# Patient Record
Sex: Female | Born: 1950 | Race: Black or African American | Hispanic: No | Marital: Single | Smoking: Current every day smoker
Health system: Southern US, Community
[De-identification: ages and names within clinical notes are randomized; demographics above are authoritative.]

## PROBLEM LIST (undated history)

## (undated) DIAGNOSIS — K5790 Diverticulosis of intestine, part unspecified, without perforation or abscess without bleeding: Secondary | ICD-10-CM

## (undated) DIAGNOSIS — E782 Mixed hyperlipidemia: Secondary | ICD-10-CM

## (undated) DIAGNOSIS — I4510 Unspecified right bundle-branch block: Secondary | ICD-10-CM

## (undated) DIAGNOSIS — I700 Atherosclerosis of aorta: Secondary | ICD-10-CM

## (undated) DIAGNOSIS — J410 Simple chronic bronchitis: Secondary | ICD-10-CM

## (undated) DIAGNOSIS — E119 Type 2 diabetes mellitus without complications: Secondary | ICD-10-CM

## (undated) DIAGNOSIS — J449 Chronic obstructive pulmonary disease, unspecified: Secondary | ICD-10-CM

## (undated) DIAGNOSIS — K219 Gastro-esophageal reflux disease without esophagitis: Secondary | ICD-10-CM

## (undated) DIAGNOSIS — Z794 Long term (current) use of insulin: Secondary | ICD-10-CM

## (undated) DIAGNOSIS — I2510 Atherosclerotic heart disease of native coronary artery without angina pectoris: Secondary | ICD-10-CM

## (undated) DIAGNOSIS — Z720 Tobacco use: Secondary | ICD-10-CM

## (undated) DIAGNOSIS — I10 Essential (primary) hypertension: Secondary | ICD-10-CM

## (undated) HISTORY — PX: APPENDECTOMY: SHX54

## (undated) HISTORY — PX: TUBAL LIGATION: SHX77

---

## 1968-06-24 HISTORY — PX: APPENDECTOMY: SHX54

## 2000-11-04 ENCOUNTER — Encounter: Payer: Self-pay | Admitting: *Deleted

## 2000-11-04 ENCOUNTER — Ambulatory Visit (HOSPITAL_COMMUNITY): Admission: RE | Admit: 2000-11-04 | Discharge: 2000-11-04 | Payer: Self-pay | Admitting: *Deleted

## 2001-07-03 ENCOUNTER — Ambulatory Visit (HOSPITAL_COMMUNITY): Admission: RE | Admit: 2001-07-03 | Discharge: 2001-07-03 | Payer: Self-pay | Admitting: *Deleted

## 2001-07-03 ENCOUNTER — Encounter: Payer: Self-pay | Admitting: *Deleted

## 2001-11-13 ENCOUNTER — Ambulatory Visit (HOSPITAL_COMMUNITY): Admission: RE | Admit: 2001-11-13 | Discharge: 2001-11-13 | Payer: Self-pay | Admitting: Family Medicine

## 2001-11-13 ENCOUNTER — Encounter: Payer: Self-pay | Admitting: Family Medicine

## 2001-12-07 ENCOUNTER — Ambulatory Visit (HOSPITAL_COMMUNITY): Admission: RE | Admit: 2001-12-07 | Discharge: 2001-12-07 | Payer: Self-pay | Admitting: Family Medicine

## 2001-12-07 ENCOUNTER — Encounter: Payer: Self-pay | Admitting: Family Medicine

## 2005-01-22 ENCOUNTER — Ambulatory Visit (HOSPITAL_COMMUNITY): Admission: RE | Admit: 2005-01-22 | Discharge: 2005-01-22 | Payer: Self-pay | Admitting: Family Medicine

## 2006-10-03 ENCOUNTER — Ambulatory Visit (HOSPITAL_COMMUNITY): Admission: RE | Admit: 2006-10-03 | Discharge: 2006-10-03 | Payer: Self-pay | Admitting: Family Medicine

## 2014-02-23 ENCOUNTER — Other Ambulatory Visit (HOSPITAL_COMMUNITY): Payer: Self-pay | Admitting: Physician Assistant

## 2014-02-23 DIAGNOSIS — R9389 Abnormal findings on diagnostic imaging of other specified body structures: Secondary | ICD-10-CM

## 2014-03-01 ENCOUNTER — Ambulatory Visit (HOSPITAL_COMMUNITY)
Admission: RE | Admit: 2014-03-01 | Discharge: 2014-03-01 | Disposition: A | Discharge status: Home / Self Care | Payer: Medicare Other | Source: Ambulatory Visit | Attending: Physician Assistant | Admitting: Physician Assistant

## 2014-03-01 DIAGNOSIS — J984 Other disorders of lung: Secondary | ICD-10-CM | POA: Diagnosis not present

## 2014-03-01 DIAGNOSIS — J438 Other emphysema: Secondary | ICD-10-CM | POA: Insufficient documentation

## 2014-03-01 DIAGNOSIS — R918 Other nonspecific abnormal finding of lung field: Secondary | ICD-10-CM | POA: Diagnosis present

## 2014-03-01 DIAGNOSIS — F17200 Nicotine dependence, unspecified, uncomplicated: Secondary | ICD-10-CM | POA: Diagnosis not present

## 2014-03-01 DIAGNOSIS — R9389 Abnormal findings on diagnostic imaging of other specified body structures: Secondary | ICD-10-CM

## 2014-03-01 MED ORDER — IOHEXOL 300 MG/ML  SOLN
80.0000 mL | Freq: Once | INTRAMUSCULAR | Status: AC | PRN
  Administered 2014-03-01: 80 mL via INTRAVENOUS

## 2014-03-02 LAB — POCT I-STAT CREATININE: Creatinine, Ser: 1.2 mg/dL — ABNORMAL HIGH (ref 0.50–1.10)

## 2015-10-04 ENCOUNTER — Other Ambulatory Visit (HOSPITAL_COMMUNITY): Payer: Self-pay | Admitting: Family

## 2015-10-04 DIAGNOSIS — Z1231 Encounter for screening mammogram for malignant neoplasm of breast: Secondary | ICD-10-CM

## 2015-10-11 ENCOUNTER — Ambulatory Visit (HOSPITAL_COMMUNITY)
Admission: RE | Admit: 2015-10-11 | Discharge: 2015-10-11 | Disposition: A | Discharge status: Home / Self Care | Payer: Medicare Other | Source: Ambulatory Visit | Attending: Family | Admitting: Family

## 2015-10-11 DIAGNOSIS — Z1231 Encounter for screening mammogram for malignant neoplasm of breast: Secondary | ICD-10-CM

## 2016-02-07 ENCOUNTER — Telehealth: Payer: Self-pay

## 2016-02-07 NOTE — Telephone Encounter (Signed)
PCP was calling to follow up on referral for tcs.

## 2016-02-08 NOTE — Telephone Encounter (Signed)
I cc'ed the letter to the PCP

## 2016-02-08 NOTE — Telephone Encounter (Signed)
Referral was received yesterday. As usual, letter mailed today. Please let PCP know.  Thanks!

## 2016-09-10 ENCOUNTER — Other Ambulatory Visit (HOSPITAL_COMMUNITY): Payer: Self-pay | Admitting: Family

## 2016-09-10 DIAGNOSIS — Z1231 Encounter for screening mammogram for malignant neoplasm of breast: Secondary | ICD-10-CM

## 2016-10-17 ENCOUNTER — Ambulatory Visit (HOSPITAL_COMMUNITY)
Admission: RE | Admit: 2016-10-17 | Discharge: 2016-10-17 | Disposition: A | Discharge status: Home / Self Care | Payer: Medicare Other | Source: Ambulatory Visit | Attending: Family | Admitting: Family

## 2016-10-17 DIAGNOSIS — Z1231 Encounter for screening mammogram for malignant neoplasm of breast: Secondary | ICD-10-CM | POA: Insufficient documentation

## 2016-12-26 ENCOUNTER — Telehealth: Payer: Self-pay

## 2016-12-26 NOTE — Telephone Encounter (Signed)
See separate triage.  

## 2016-12-26 NOTE — Telephone Encounter (Signed)
[phone] patient received letter to schedule tcs

## 2017-01-15 NOTE — Telephone Encounter (Signed)
Waiting on call from pt to see if she has ever had any complications with anesthesia.

## 2017-01-23 NOTE — Telephone Encounter (Signed)
No diabetes medication day of procedure. Appropriate.

## 2017-01-23 NOTE — Telephone Encounter (Signed)
Gastroenterology Pre-Procedure Review  Request Date:12/26/2016 Requesting Physician: Dr. Kenton Kingfisher  PATIENT REVIEW QUESTIONS: The patient responded to the following health history questions as indicated:    1. Diabetes Melitis: YES 2. Joint replacements in the past 12 months: no 3. Major health problems in the past 3 months: no 4. Has an artificial valve or MVP: no 5. Has a defibrillator: no 6. Has been advised in past to take antibiotics in advance of a procedure like teeth cleaning: no 7. Family history of colon cancer: YES    Father died at age 70, not sure when he was diagnosed 64. Alcohol Use: no 9. History of sleep apnea: no  10. History of coronary artery or other vascular stents placed within the last 12 months: no 11. History of any prior anesthesia complications: no    MEDICATIONS & ALLERGIES:    Patient reports the following regarding taking any blood thinners:   Plavix? no Aspirin?YES Coumadin? no Brilinta? no Xarelto? no Eliquis? no Pradaxa? no Savaysa? no Effient? no  Patient confirms/reports the following medications:  Current Outpatient Prescriptions  Medication Sig Dispense Refill  . amLODipine (NORVASC) 5 MG tablet Take 5 mg by mouth daily.    Marland Kitchen aspirin EC 81 MG tablet Take 81 mg by mouth daily.    Marland Kitchen atorvastatin (LIPITOR) 40 MG tablet Take 40 mg by mouth daily.    . cetirizine (ZYRTEC) 10 MG tablet Take 10 mg by mouth daily.    . Cinnamon 500 MG TABS Take 1,000 mg by mouth daily.    Marland Kitchen glimepiride (AMARYL) 4 MG tablet Take 4 mg by mouth daily with breakfast.    . lisinopril (PRINIVIL,ZESTRIL) 40 MG tablet Take 40 mg by mouth daily.    . metFORMIN (GLUCOPHAGE) 500 MG tablet Take by mouth 2 (two) times daily with a meal.    . Multiple Vitamin (MULTIVITAMIN) tablet Take 1 tablet by mouth daily.    . Omega-3 Fatty Acids (FISH OIL) 1200 MG CAPS Take by mouth 2 (two) times daily.     No current facility-administered medications for this visit.     Patient  confirms/reports the following allergies:  Allergies  Allergen Reactions  . Other     ORANGES/ CAUSES SWELLING IN EYES AND LIPS    No orders of the defined types were placed in this encounter.   AUTHORIZATION INFORMATION Primary Insurance:  ID #:   Group #:  Pre-Cert / Auth required:  Pre-Cert / Auth #:   Secondary Insurance:  ID #:   Group #:  Pre-Cert / Auth required:  Pre-Cert / Auth #:   SCHEDULE INFORMATION: Procedure has been scheduled as follows:  Date:                    Time:   Location:   This Gastroenterology Pre-Precedure Review Form is being routed to the following provider(s): R. Garfield Cornea, MD

## 2017-02-05 NOTE — Telephone Encounter (Signed)
LMOM to call, needs date and time for colonoscopy with Dr. Gala Romney.

## 2017-02-06 ENCOUNTER — Other Ambulatory Visit: Payer: Self-pay

## 2017-02-06 DIAGNOSIS — Z800 Family history of malignant neoplasm of digestive organs: Secondary | ICD-10-CM

## 2017-02-06 MED ORDER — PEG 3350-KCL-NA BICARB-NACL 420 G PO SOLR
4000.0000 mL | ORAL | 0 refills | Status: DC
Start: 2017-02-06 — End: 2021-12-14

## 2017-02-06 NOTE — Telephone Encounter (Signed)
Rx sent to the pharmacy and instructions mailed to pt.  

## 2017-02-06 NOTE — Telephone Encounter (Signed)
PT called and has been scheduled for 02/19/2017 at 8:15 Am with Dr. Gala Romney.

## 2017-02-14 ENCOUNTER — Ambulatory Visit: Payer: Medicare Other

## 2017-02-18 NOTE — Telephone Encounter (Signed)
NO PA is needed for TCS 

## 2017-02-19 ENCOUNTER — Encounter (HOSPITAL_COMMUNITY)
Admission: RE | Disposition: A | Discharge status: Home / Self Care | Payer: Self-pay | Source: Ambulatory Visit | Attending: Internal Medicine

## 2017-02-19 ENCOUNTER — Ambulatory Visit (HOSPITAL_COMMUNITY)
Admission: RE | Admit: 2017-02-19 | Discharge: 2017-02-19 | Disposition: A | Discharge status: Home / Self Care | Payer: Medicare Other | Source: Ambulatory Visit | Attending: Internal Medicine | Admitting: Internal Medicine

## 2017-02-19 ENCOUNTER — Encounter (HOSPITAL_COMMUNITY): Payer: Self-pay | Admitting: *Deleted

## 2017-02-19 DIAGNOSIS — F17210 Nicotine dependence, cigarettes, uncomplicated: Secondary | ICD-10-CM | POA: Diagnosis not present

## 2017-02-19 DIAGNOSIS — Z79899 Other long term (current) drug therapy: Secondary | ICD-10-CM | POA: Diagnosis not present

## 2017-02-19 DIAGNOSIS — I10 Essential (primary) hypertension: Secondary | ICD-10-CM | POA: Insufficient documentation

## 2017-02-19 DIAGNOSIS — Z7982 Long term (current) use of aspirin: Secondary | ICD-10-CM | POA: Insufficient documentation

## 2017-02-19 DIAGNOSIS — K5790 Diverticulosis of intestine, part unspecified, without perforation or abscess without bleeding: Secondary | ICD-10-CM | POA: Insufficient documentation

## 2017-02-19 DIAGNOSIS — E119 Type 2 diabetes mellitus without complications: Secondary | ICD-10-CM | POA: Insufficient documentation

## 2017-02-19 DIAGNOSIS — Z1212 Encounter for screening for malignant neoplasm of rectum: Secondary | ICD-10-CM | POA: Diagnosis not present

## 2017-02-19 DIAGNOSIS — Z800 Family history of malignant neoplasm of digestive organs: Secondary | ICD-10-CM | POA: Diagnosis not present

## 2017-02-19 DIAGNOSIS — D122 Benign neoplasm of ascending colon: Secondary | ICD-10-CM | POA: Insufficient documentation

## 2017-02-19 DIAGNOSIS — Z1211 Encounter for screening for malignant neoplasm of colon: Secondary | ICD-10-CM | POA: Insufficient documentation

## 2017-02-19 DIAGNOSIS — Z794 Long term (current) use of insulin: Secondary | ICD-10-CM | POA: Insufficient documentation

## 2017-02-19 HISTORY — PX: COLONOSCOPY: SHX5424

## 2017-02-19 HISTORY — DX: Essential (primary) hypertension: I10

## 2017-02-19 HISTORY — DX: Type 2 diabetes mellitus without complications: E11.9

## 2017-02-19 LAB — GLUCOSE, CAPILLARY: GLUCOSE-CAPILLARY: 165 mg/dL — AB (ref 65–99)

## 2017-02-19 SURGERY — COLONOSCOPY
Anesthesia: Moderate Sedation

## 2017-02-19 MED ORDER — MEPERIDINE HCL 100 MG/ML IJ SOLN
INTRAMUSCULAR | Status: AC
  Filled 2017-02-19: qty 2

## 2017-02-19 MED ORDER — ONDANSETRON HCL 4 MG/2ML IJ SOLN
INTRAMUSCULAR | Status: AC
  Filled 2017-02-19: qty 2

## 2017-02-19 MED ORDER — ONDANSETRON HCL 4 MG/2ML IJ SOLN
INTRAMUSCULAR | Status: DC | PRN
  Administered 2017-02-19: 4 mg via INTRAVENOUS

## 2017-02-19 MED ORDER — MIDAZOLAM HCL 5 MG/5ML IJ SOLN
INTRAMUSCULAR | Status: AC
  Filled 2017-02-19: qty 10

## 2017-02-19 MED ORDER — STERILE WATER FOR IRRIGATION IR SOLN
Status: DC | PRN
  Administered 2017-02-19: 09:00:00

## 2017-02-19 MED ORDER — SODIUM CHLORIDE 0.9 % IV SOLN
INTRAVENOUS | Status: DC
  Administered 2017-02-19: 1000 mL via INTRAVENOUS

## 2017-02-19 MED ORDER — MEPERIDINE HCL 100 MG/ML IJ SOLN
INTRAMUSCULAR | Status: DC | PRN
  Administered 2017-02-19: 25 mg via INTRAVENOUS

## 2017-02-19 MED ORDER — MIDAZOLAM HCL 5 MG/5ML IJ SOLN
INTRAMUSCULAR | Status: DC | PRN
  Administered 2017-02-19: 2 mg via INTRAVENOUS
  Administered 2017-02-19: 1 mg via INTRAVENOUS

## 2017-02-19 NOTE — Op Note (Signed)
[facility] Patient Name: Kristin Hughes Procedure Date: 02/19/2017 8:30 AM MRN: [id number] Date of Birth: 02-13-1951 Attending MD: Norvel Richards , MD CSN: [id number] Age: 66 Admit Type: Outpatient Procedure:                Colonoscopy Indications:              Screening in patient at increased risk: Family                            history of 1st-degree relative with colorectal                            cancer Providers:                Norvel Richards, MD, Hinton Rao, RN,                            Randa Spike, Technician Referring MD:              Medicines:                Midazolam 3 mg IV, Meperidine 25 mg IV Complications:            No immediate complications. Estimated Blood Loss:     Estimated blood loss was minimal. Procedure:                Pre-Anesthesia Assessment:                           - Prior to the procedure, a History and Physical                            was performed, and patient medications and                            allergies were reviewed. The patient's tolerance of                            previous anesthesia was also reviewed. The risks                            and benefits of the procedure and the sedation                            options and risks were discussed with the patient.                            All questions were answered, and informed consent                            was obtained. Prior Anticoagulants: The patient has                            taken no previous anticoagulant or antiplatelet  agents. ASA Grade Assessment: II - A patient with                            mild systemic disease. After reviewing the risks                            and benefits, the patient was deemed in                            satisfactory condition to undergo the procedure.                           After obtaining informed consent, the colonoscope                            was passed under  direct vision. Throughout the                            procedure, the patient's blood pressure, pulse, and                            oxygen saturations were monitored continuously. The                            [id number]) scope was introduced through the                            anus and advanced to the the cecum, identified by                            appendiceal orifice and ileocecal valve. The                            ileocecal valve, appendiceal orifice, and rectum                            were photographed. The entire colon was well                            visualized. The ileocecal valve, appendiceal                            orifice, and rectum were photographed. The quality                            of the bowel preparation was adequate. Scope In: 8:57:29 AM Scope Out: 9:13:57 AM Scope Withdrawal Time: 0 hours 12 minutes 40 seconds  Total Procedure Duration: 0 hours 16 minutes 28 seconds  Findings:      The perianal and digital rectal examinations were normal.      Two sessile polyps were found in the ascending colon. The polyps were 4       to 6 mm in size. These polyps were removed with a cold snare. Resection       and retrieval  were complete. Estimated blood loss was minimal.      The exam was otherwise without abnormality on direct and retroflexion       views. shallow diverticula scattered throughout the colon. Impression:               - Two 4 to 6 mm polyps in the ascending colon,                            removed with a cold snare. Resected and retrieved.                            Shallow diverticulosis                           - The examination was otherwise normal on direct                            and retroflexion views. Moderate Sedation:      Moderate (conscious) sedation was administered by the endoscopy nurse       and supervised by the endoscopist. The following parameters were       monitored: oxygen saturation, heart rate, blood  pressure, respiratory       rate, EKG, adequacy of pulmonary ventilation, and response to care.       Total physician intraservice time was 28 minutes. Recommendation:           - Patient has a contact number available for                            emergencies. The signs and symptoms of potential                            delayed complications were discussed with the                            patient. Return to normal activities tomorrow.                            Written discharge instructions were provided to the                            patient.                           - Resume previous diet.                           - Continue present medications.                           - Repeat colonoscopy date to be determined after                            pending pathology results are reviewed for                            surveillance  based on pathology results.                           - Return to GI clinic (date not yet determined). Procedure Code(s):        --- Professional ---                           [phone], Colonoscopy, flexible; with removal of                            tumor(s), polyp(s), or other lesion(s) by snare                            technique                           [id number], Moderate sedation services provided by the                            same physician or other qualified health care                            professional performing the diagnostic or                            therapeutic service that the sedation supports,                            requiring the presence of an independent trained                            observer to assist in the monitoring of the                            patient's level of consciousness and physiological                            status; initial 15 minutes of intraservice time,                            patient age 53 years or older                           [phone], Moderate sedation services; each additional                             15 minutes intraservice time Diagnosis Code(s):        --- Professional ---                           Z80.0, Family history of malignant neoplasm of                            digestive organs  D12.2, Benign neoplasm of ascending colon CPT copyright 2016 American [employer] Association. All rights reserved. The codes documented in this report are preliminary and upon coder review may  be revised to meet current compliance requirements. Cristopher Estimable. Rourk, MD Norvel Richards, MD 02/19/2017 9:20:43 AM This report has been signed electronically. Number of Addenda: 0

## 2017-02-19 NOTE — Discharge Instructions (Signed)
Colon Polyps °Polyps are tissue growths inside the body. Polyps can grow in many places, including the large intestine (colon). A polyp may be a round bump or a mushroom-shaped growth. You could have one polyp or several. °Most colon polyps are noncancerous (benign). However, some colon polyps can become cancerous over time. °What are the causes? °The exact cause of colon polyps is not known. °What increases the risk? °This condition is more likely to develop in people who: °· Have a family history of colon cancer or colon polyps. °· Are older than 50 or older than 45 if they are African American. °· Have inflammatory bowel disease, such as ulcerative colitis or Crohn disease. °· Are overweight. °· Smoke cigarettes. °· Do not get enough exercise. °· Drink too much alcohol. °· Eat a diet that is: °? High in fat and red meat. °? Low in fiber. °· Had childhood cancer that was treated with abdominal radiation. ° °What are the signs or symptoms? °Most polyps do not cause symptoms. If you have symptoms, they may include: °· Blood coming from your rectum when having a bowel movement. °· Blood in your stool. The stool may look dark red or black. °· A change in bowel habits, such as constipation or diarrhea. ° °How is this diagnosed? °This condition is diagnosed with a colonoscopy. This is a procedure that uses a lighted, flexible scope to look at the inside of your colon. °How is this treated? °Treatment for this condition involves removing any polyps that are found. Those polyps will then be tested for cancer. If cancer is found, your health care provider will talk to you about options for colon cancer treatment. °Follow these instructions at home: °Diet °· Eat plenty of fiber, such as fruits, vegetables, and whole grains. °· Eat foods that are high in calcium and vitamin D, such as milk, cheese, yogurt, eggs, liver, fish, and broccoli. °· Limit foods high in fat, red meats, and processed meats, such as hot dogs, sausage,  bacon, and lunch meats. °· Maintain a healthy weight, or lose weight if recommended by your health care provider. °General instructions °· Do not smoke cigarettes. °· Do not drink alcohol excessively. °· Keep all follow-up visits as told by your health care provider. This is important. This includes keeping regularly scheduled colonoscopies. Talk to your health care provider about when you need a colonoscopy. °· Exercise every day or as told by your health care provider. °Contact a health care provider if: °· You have new or worsening bleeding during a bowel movement. °· You have new or increased blood in your stool. °· You have a change in bowel habits. °· You unexpectedly lose weight. °This information is not intended to replace advice given to you by your health care provider. Make sure you discuss any questions you have with your health care provider. °Document Released: 03/06/2004 Document Revised: 11/16/2015 Document Reviewed: 05/01/2015 °Elsevier Interactive Patient Education © 2018 [employer] ° °Colonoscopy °Discharge Instructions ° °Read the instructions outlined below and refer to this sheet in the next few weeks. These discharge instructions provide you with general information on caring for yourself after you leave the hospital. Your doctor may also give you specific instructions. While your treatment has been planned according to the most current medical practices available, unavoidable complications occasionally occur. If you have any problems or questions after discharge, call Dr. Rourk at [phone]. °ACTIVITY °· You may resume your regular activity, but move at a slower pace for the next 24   hours.  °· Take frequent rest periods for the next 24 hours.  °· Walking will help get rid of the air and reduce the bloated feeling in your belly (abdomen).  °· No driving for 24 hours (because of the medicine (anesthesia) used during the test).   °· Do not sign any important legal documents or operate any  machinery for 24 hours (because of the anesthesia used during the test).  °NUTRITION °· Drink plenty of fluids.  °· You may resume your normal diet as instructed by your doctor.  °· Begin with a light meal and progress to your normal diet. Heavy or fried foods are harder to digest and may make you feel sick to your stomach (nauseated).  °· Avoid alcoholic beverages for 24 hours or as instructed.  °MEDICATIONS °· You may resume your normal medications unless your doctor tells you otherwise.  °WHAT YOU CAN EXPECT TODAY °· Some feelings of bloating in the abdomen.  °· Passage of more gas than usual.  °· Spotting of blood in your stool or on the toilet paper.  °IF YOU HAD POLYPS REMOVED DURING THE COLONOSCOPY: °· No aspirin products for 7 days or as instructed.  °· No alcohol for 7 days or as instructed.  °· Eat a soft diet for the next 24 hours.  °FINDING OUT THE RESULTS OF YOUR TEST °Not all test results are available during your visit. If your test results are not back during the visit, make an appointment with your caregiver to find out the results. Do not assume everything is normal if you have not heard from your caregiver or the medical facility. It is important for you to follow up on all of your test results.  °SEEK IMMEDIATE MEDICAL ATTENTION IF: °· You have more than a spotting of blood in your stool.  °· Your belly is swollen (abdominal distention).  °· You are nauseated or vomiting.  °· You have a temperature over 101.  °· You have abdominal pain or discomfort that is severe or gets worse throughout the day.  ° ° ° °Colon polyp information provided ° °Further recommendations to follow pending review of pathology report °

## 2017-02-19 NOTE — H&P (Signed)
@LOGO @   Primary Care Physician:  Joyice Faster, FNP Primary Gastroenterologist:  Dr. Gala Romney  Pre-Procedure History & Physical: HPI:  Kristin Hughes is a 66 y.o. female here for first ever screening colonoscopy. No bowel symptoms. Family history of colon cancer in patient's father.  Past Medical History:  Diagnosis Date  . Diabetes mellitus without complication ([place])   . Hypertension     Past Surgical History:  Procedure Laterality Date  . APPENDECTOMY    . TUBAL LIGATION      Prior to Admission medications   Medication Sig Start Date End Date Taking? Authorizing Provider  amLODipine (NORVASC) 5 MG tablet Take 5 mg by mouth daily.   Yes [provider]  aspirin EC 81 MG tablet Take 81 mg by mouth daily.   Yes [provider]  atorvastatin (LIPITOR) 40 MG tablet Take 40 mg by mouth daily.   Yes [provider]  cetirizine (ZYRTEC) 10 MG tablet Take 10 mg by mouth daily.   Yes [provider]  CINNAMON PO Take 2,000 mg by mouth at bedtime.   Yes [provider]  glimepiride (AMARYL) 4 MG tablet Take 4 mg by mouth daily with breakfast.   Yes [provider]  Ibuprofen-Diphenhydramine Cit (ADVIL PM) 200-38 MG TABS Take 1 tablet by mouth at bedtime as needed (for sleep.).   Yes [provider]  LEVEMIR FLEXTOUCH 100 UNIT/ML Pen Inject 26 Units into the skin at bedtime.  02/04/17  Yes [provider]  lisinopril (PRINIVIL,ZESTRIL) 40 MG tablet Take 40 mg by mouth daily.   Yes [provider]  metFORMIN (GLUCOPHAGE) 500 MG tablet Take 500 mg by mouth 2 (two) times daily with a meal.    Yes [provider]  Multiple Vitamin (MULTIVITAMIN) tablet Take 1 tablet by mouth daily. CENTRUM SILVER   Yes [provider]  Omega-3 Fatty Acids (FISH OIL) 1200 MG CAPS Take 2,400 mg by mouth at bedtime.    Yes [provider]  polyethylene glycol-electrolytes (TRILYTE) 420 g solution Take  4,000 mLs by mouth as directed. 02/06/17  Yes Daneil Dolin, MD    Allergies as of 02/06/2017 - Review Complete 01/13/2017  Allergen Reaction Noted  . Other  01/13/2017    Family History  Problem Relation Age of Onset  . Colon cancer Father     Social History   Social History  . Marital status: Single    Spouse name: N/A  . Number of children: N/A  . Years of education: N/A   Occupational History  . Not on file.   Social History Main Topics  . Smoking status: Current Every Day Smoker    Packs/day: 1.00    Types: Cigarettes  . Smokeless tobacco: Never Used  . Alcohol use No  . Drug use: No  . Sexual activity: Not on file   Other Topics Concern  . Not on file   Social History Narrative  . No narrative on file    Review of Systems: See HPI, otherwise negative ROS  Physical Exam: BP (!) 144/106   Pulse 95   Temp 97.9 F (36.6 C) (Oral)   Resp 18   Ht 5\' 4"  (1.626 m)   Wt 172 lb (78 kg)   SpO2 97%   BMI 29.52 kg/m  General:   Alert,  Well-developed, well-nourished, pleasant and cooperative in NAD Neck:  Supple; no masses or thyromegaly. No significant cervical adenopathy. Lungs:  Clear throughout to auscultation.  No wheezes, crackles, or rhonchi. No acute distress. Heart:  Regular rate and rhythm; no murmurs, clicks, rubs,  or gallops. Abdomen: Non-distended, normal bowel sounds.  Soft and nontender without appreciable mass or hepatosplenomegaly.  Pulses:  Normal pulses noted. Extremities:  Without clubbing or edema.  Impression: Pleasant 66 year old lady here for first ever screening colonoscopy. Positive family history colon cancer in her father.   Recommendations:  Screening colonoscopy today per plan.The risks, benefits, limitations, alternatives and imponderables have been reviewed with the patient. Questions have been answered. All parties are agreeable.   Notice: This dictation was prepared with Dragon dictation along with smaller phrase  technology. Any transcriptional errors that result from this process are unintentional and may not be corrected upon review.

## 2017-02-21 ENCOUNTER — Encounter: Payer: Self-pay | Admitting: Internal Medicine

## 2017-02-21 ENCOUNTER — Encounter (HOSPITAL_COMMUNITY): Payer: Self-pay | Admitting: Internal Medicine

## 2017-04-03 ENCOUNTER — Ambulatory Visit: Payer: Medicare Other | Admitting: Internal Medicine

## 2017-11-24 ENCOUNTER — Other Ambulatory Visit (HOSPITAL_COMMUNITY): Payer: Self-pay | Admitting: Family

## 2017-11-24 DIAGNOSIS — Z1231 Encounter for screening mammogram for malignant neoplasm of breast: Secondary | ICD-10-CM

## 2017-12-03 ENCOUNTER — Ambulatory Visit (HOSPITAL_COMMUNITY)
Admission: RE | Admit: 2017-12-03 | Discharge: 2017-12-03 | Disposition: A | Discharge status: Home / Self Care | Payer: Medicare Other | Source: Ambulatory Visit | Attending: Family | Admitting: Family

## 2017-12-03 ENCOUNTER — Encounter (HOSPITAL_COMMUNITY): Payer: Self-pay

## 2017-12-03 ENCOUNTER — Ambulatory Visit (HOSPITAL_COMMUNITY): Payer: Medicare Other

## 2017-12-03 DIAGNOSIS — Z1231 Encounter for screening mammogram for malignant neoplasm of breast: Secondary | ICD-10-CM | POA: Diagnosis present

## 2017-12-10 ENCOUNTER — Ambulatory Visit (HOSPITAL_COMMUNITY): Payer: Medicare Other

## 2019-01-13 ENCOUNTER — Other Ambulatory Visit (HOSPITAL_COMMUNITY): Payer: Self-pay | Admitting: Family

## 2019-01-13 DIAGNOSIS — Z1231 Encounter for screening mammogram for malignant neoplasm of breast: Secondary | ICD-10-CM

## 2020-10-31 ENCOUNTER — Other Ambulatory Visit (HOSPITAL_COMMUNITY): Payer: Self-pay | Admitting: Family Medicine

## 2020-10-31 ENCOUNTER — Other Ambulatory Visit: Payer: Self-pay | Admitting: Family Medicine

## 2020-10-31 DIAGNOSIS — F17200 Nicotine dependence, unspecified, uncomplicated: Secondary | ICD-10-CM

## 2020-10-31 DIAGNOSIS — J410 Simple chronic bronchitis: Secondary | ICD-10-CM

## 2020-11-29 ENCOUNTER — Ambulatory Visit (HOSPITAL_COMMUNITY)
Admission: RE | Admit: 2020-11-29 | Discharge: 2020-11-29 | Disposition: A | Discharge status: Home / Self Care | Payer: Medicare Other | Source: Ambulatory Visit | Attending: Family Medicine | Admitting: Family Medicine

## 2020-11-29 DIAGNOSIS — J410 Simple chronic bronchitis: Secondary | ICD-10-CM | POA: Diagnosis present

## 2020-11-29 DIAGNOSIS — J439 Emphysema, unspecified: Secondary | ICD-10-CM | POA: Diagnosis not present

## 2020-11-29 DIAGNOSIS — F17200 Nicotine dependence, unspecified, uncomplicated: Secondary | ICD-10-CM

## 2020-11-29 DIAGNOSIS — I700 Atherosclerosis of aorta: Secondary | ICD-10-CM | POA: Insufficient documentation

## 2020-11-29 DIAGNOSIS — F17210 Nicotine dependence, cigarettes, uncomplicated: Secondary | ICD-10-CM | POA: Insufficient documentation

## 2020-11-29 DIAGNOSIS — R911 Solitary pulmonary nodule: Secondary | ICD-10-CM

## 2020-11-29 HISTORY — DX: Solitary pulmonary nodule: R91.1

## 2021-11-08 ENCOUNTER — Other Ambulatory Visit (HOSPITAL_COMMUNITY): Payer: Self-pay | Admitting: Family Medicine

## 2021-11-08 ENCOUNTER — Other Ambulatory Visit: Payer: Self-pay | Admitting: Family Medicine

## 2021-11-08 DIAGNOSIS — I714 Abdominal aortic aneurysm, without rupture, unspecified: Secondary | ICD-10-CM

## 2021-11-09 ENCOUNTER — Other Ambulatory Visit (HOSPITAL_COMMUNITY): Payer: Self-pay | Admitting: Family

## 2021-11-09 ENCOUNTER — Ambulatory Visit (HOSPITAL_COMMUNITY)
Admission: RE | Admit: 2021-11-09 | Discharge: 2021-11-09 | Disposition: A | Discharge status: Home / Self Care | Payer: Medicare Other | Source: Ambulatory Visit | Attending: Family Medicine | Admitting: Family Medicine

## 2021-11-09 DIAGNOSIS — I714 Abdominal aortic aneurysm, without rupture, unspecified: Secondary | ICD-10-CM

## 2021-11-09 HISTORY — DX: Abdominal aortic aneurysm, without rupture, unspecified: I71.40

## 2021-11-09 MED ORDER — IOHEXOL 350 MG/ML SOLN
100.0000 mL | Freq: Once | INTRAVENOUS | Status: AC | PRN
  Administered 2021-11-09: 100 mL via INTRAVENOUS

## 2021-11-13 LAB — POCT I-STAT CREATININE: Creatinine, Ser: 1.1 mg/dL — ABNORMAL HIGH (ref 0.44–1.00)

## 2021-11-16 ENCOUNTER — Ambulatory Visit (INDEPENDENT_AMBULATORY_CARE_PROVIDER_SITE_OTHER): Payer: Medicare Other | Admitting: Vascular Surgery

## 2021-11-16 ENCOUNTER — Encounter (INDEPENDENT_AMBULATORY_CARE_PROVIDER_SITE_OTHER): Payer: Self-pay | Admitting: Vascular Surgery

## 2021-11-16 VITALS — BP 161/78 | HR 94 | Resp 16 | Ht 64.0 in | Wt 153.8 lb

## 2021-11-16 DIAGNOSIS — E1165 Type 2 diabetes mellitus with hyperglycemia: Secondary | ICD-10-CM | POA: Insufficient documentation

## 2021-11-16 DIAGNOSIS — E119 Type 2 diabetes mellitus without complications: Secondary | ICD-10-CM | POA: Diagnosis not present

## 2021-11-16 DIAGNOSIS — J410 Simple chronic bronchitis: Secondary | ICD-10-CM | POA: Insufficient documentation

## 2021-11-16 DIAGNOSIS — Z800 Family history of malignant neoplasm of digestive organs: Secondary | ICD-10-CM | POA: Insufficient documentation

## 2021-11-16 DIAGNOSIS — K219 Gastro-esophageal reflux disease without esophagitis: Secondary | ICD-10-CM | POA: Insufficient documentation

## 2021-11-16 DIAGNOSIS — E782 Mixed hyperlipidemia: Secondary | ICD-10-CM | POA: Diagnosis not present

## 2021-11-16 DIAGNOSIS — I7143 Infrarenal abdominal aortic aneurysm, without rupture: Secondary | ICD-10-CM

## 2021-11-16 DIAGNOSIS — I10 Essential (primary) hypertension: Secondary | ICD-10-CM | POA: Diagnosis not present

## 2021-11-16 DIAGNOSIS — Z720 Tobacco use: Secondary | ICD-10-CM | POA: Insufficient documentation

## 2021-11-16 DIAGNOSIS — Z1239 Encounter for other screening for malignant neoplasm of breast: Secondary | ICD-10-CM | POA: Insufficient documentation

## 2021-11-16 DIAGNOSIS — I714 Abdominal aortic aneurysm, without rupture, unspecified: Secondary | ICD-10-CM | POA: Insufficient documentation

## 2021-11-16 DIAGNOSIS — Z6830 Body mass index (BMI) 30.0-30.9, adult: Secondary | ICD-10-CM | POA: Insufficient documentation

## 2021-11-16 DIAGNOSIS — Z794 Long term (current) use of insulin: Secondary | ICD-10-CM | POA: Insufficient documentation

## 2021-11-16 DIAGNOSIS — J449 Chronic obstructive pulmonary disease, unspecified: Secondary | ICD-10-CM | POA: Insufficient documentation

## 2021-11-16 NOTE — Assessment & Plan Note (Signed)
blood glucose control important in reducing the progression of atherosclerotic disease. Also, involved in wound healing. On appropriate medications.  

## 2021-11-16 NOTE — Assessment & Plan Note (Signed)
blood pressure control important in reducing the progression of atherosclerotic disease and aneurysmal growth. On appropriate oral medications.  

## 2021-11-16 NOTE — Progress Notes (Signed)
Patient ID: Kristin Hughes, female   DOB: 11-22-1950, 71 y.o.   MRN: [id number]  Chief Complaint  Patient presents with   New Patient (Initial Visit)    Ref Kristin Hughes consult AAA    HPI Kristin Hughes is a 71 y.o. female.  I am asked to see the patient by Kristin Hughes for evaluation of an AAA.  The patient was having some back pain and had some plain x-rays which showed calcification and dilatation of the aorta.  This was immediately followed by CT scan of the abdomen and pelvis last week which I have independently reviewed.  This demonstrates an approximately 5.3 cm infrarenal abdominal aortic aneurysm.  There does appear to be some chronic dissection and mural thrombus associated with the aneurysm as well as some occlusive disease in the iliac arteries and visceral vessels.  She has chronic low back pain but no severe tearing back or abdominal pain and no signs of peripheral embolization.  She does not have a family history of aneurysm to her knowledge.  She does smoke and have hypertension.     Past Medical History:  Diagnosis Date   Diabetes mellitus without complication ([place])    Hypertension     Past Surgical History:  Procedure Laterality Date   APPENDECTOMY     COLONOSCOPY N/A 02/19/2017   Procedure: COLONOSCOPY;  Surgeon: Kristin Dolin, MD;  Location: [facility];  Service: Endoscopy;  Laterality: N/A;  8:15 am   TUBAL LIGATION       Family History  Problem Relation Age of Onset   Colon cancer Father   No bleeding or clotting disorders No aneurysms    Social History   Tobacco Use   Smoking status: Every Day    Packs/day: 1.00    Types: Cigarettes   Smokeless tobacco: Never  Vaping Use   Vaping Use: Never used  Substance Use Topics   Alcohol use: No   Drug use: No     Allergies  Allergen Reactions   Other     ORANGES/ CAUSES SWELLING IN EYES AND LIPS   Orange Oil     Other reaction(s): eyes, lips swells    Current Outpatient Medications   Medication Sig Dispense Refill   amLODipine (NORVASC) 5 MG tablet Take 5 mg by mouth daily.     aspirin EC 81 MG tablet Take 81 mg by mouth daily.     atorvastatin (LIPITOR) 40 MG tablet Take 40 mg by mouth daily.     cetirizine (ZYRTEC) 10 MG tablet Take 10 mg by mouth daily.     CINNAMON PO Take 2,000 mg by mouth at bedtime.     glimepiride (AMARYL) 4 MG tablet Take 4 mg by mouth daily with breakfast.     Ibuprofen-diphenhydrAMINE Cit 200-38 MG TABS Take 1 tablet by mouth at bedtime as needed (for sleep.).     LEVEMIR FLEXTOUCH 100 UNIT/ML Pen Inject 26 Units into the skin at bedtime.      lisinopril (PRINIVIL,ZESTRIL) 40 MG tablet Take 40 mg by mouth daily.     metFORMIN (GLUCOPHAGE) 500 MG tablet Take 500 mg by mouth 2 (two) times daily with a meal.      Multiple Vitamin (MULTIVITAMIN) tablet Take 1 tablet by mouth daily. CENTRUM SILVER     Omega-3 Fatty Acids (FISH OIL) 1200 MG CAPS Take 2,400 mg by mouth at bedtime.      polyethylene glycol-electrolytes (TRILYTE) 420 g solution Take 4,000 mLs by mouth as directed.  4000 mL 0   No current facility-administered medications for this visit.      REVIEW OF SYSTEMS (Negative unless checked)  Constitutional: '[]'$ Weight loss  '[]'$ Fever  '[]'$ Chills Cardiac: '[]'$ Chest pain   '[]'$ Chest pressure   '[]'$ Palpitations   '[]'$ Shortness of breath when laying flat   '[]'$ Shortness of breath at rest   '[]'$ Shortness of breath with exertion. Vascular:  '[]'$ Pain in legs with walking   '[]'$ Pain in legs at rest   '[]'$ Pain in legs when laying flat   '[]'$ Claudication   '[]'$ Pain in feet when walking  '[]'$ Pain in feet at rest  '[]'$ Pain in feet when laying flat   '[]'$ History of DVT   '[]'$ Phlebitis   '[]'$ Swelling in legs   '[]'$ Varicose veins   '[]'$ Non-healing ulcers Pulmonary:   '[]'$ Uses home oxygen   '[]'$ Productive cough   '[]'$ Hemoptysis   '[]'$ Wheeze  '[]'$ COPD   '[]'$ Asthma Neurologic:  '[]'$ Dizziness  '[]'$ Blackouts   '[]'$ Seizures   '[]'$ History of stroke   '[]'$ History of TIA  '[]'$ Aphasia   '[]'$ Temporary blindness   '[]'$ Dysphagia    '[]'$ Weakness or numbness in arms   '[]'$ Weakness or numbness in legs Musculoskeletal:  '[x]'$ Arthritis   '[]'$ Joint swelling   '[]'$ Joint pain   '[x]'$ Low back pain Hematologic:  '[]'$ Easy bruising  '[]'$ Easy bleeding   '[]'$ Hypercoagulable state   '[]'$ Anemic  '[]'$ Hepatitis Gastrointestinal:  '[]'$ Blood in stool   '[]'$ Vomiting blood  '[]'$ Gastroesophageal reflux/heartburn   '[]'$ Abdominal pain Genitourinary:  '[]'$ Chronic kidney disease   '[]'$ Difficult urination  '[]'$ Frequent urination  '[]'$ Burning with urination   '[]'$ Hematuria Skin:  '[]'$ Rashes   '[]'$ Ulcers   '[]'$ Wounds Psychological:  '[]'$ History of anxiety   '[]'$  History of major depression.    Physical Exam BP (!) 161/78 (BP Location: Left Arm)   Pulse 94   Resp 16   Ht '5\' 4"'$  (1.626 m)   Wt 153 lb 12.8 oz (69.8 kg)   BMI 26.40 kg/m  Gen:  WD/WN, NAD Head: [place]/AT, No temporalis wasting.  Ear/Nose/Throat: Hearing grossly intact, nares w/o erythema or drainage, oropharynx w/o Erythema/Exudate Eyes: Conjunctiva clear, sclera non-icteric  Neck: trachea midline.  No JVD.  Pulmonary:  Good air movement, respirations not labored, no use of accessory muscles  Cardiac: RRR, no JVD Vascular:  Vessel Right Left  Radial Palpable Palpable                                   Gastrointestinal:. No masses, surgical incisions, or scars.  Increased aortic impulse Musculoskeletal: M/S 5/5 throughout.  Extremities without ischemic changes.  No deformity or atrophy.  No significant lower extremity edema. Neurologic: Sensation grossly intact in extremities.  Symmetrical.  Speech is fluent. Motor exam as listed above. Psychiatric: Judgment intact, Mood & affect appropriate for pt's clinical situation. Dermatologic: No rashes or ulcers noted.  No cellulitis or open wounds.    Radiology CT Angio Abd/Pel w/ and/or w/o  Result Date: 11/09/2021 CLINICAL DATA:  Low back pain for the past several months. EXAM: CTA ABDOMEN AND PELVIS WITHOUT AND WITH CONTRAST TECHNIQUE: Multidetector CT imaging of the abdomen  and pelvis was performed using the standard protocol during bolus administration of intravenous contrast. Multiplanar reconstructed images and MIPs were obtained and reviewed to evaluate the vascular anatomy. RADIATION DOSE REDUCTION: This exam was performed according to the departmental dose-optimization program which includes automated exposure control, adjustment of the mA and/or kV according to patient size and/or use of iterative reconstruction technique. CONTRAST:  139m OMNIPAQUE IOHEXOL 350 MG/ML  SOLN COMPARISON:  Chest CT-11/29/2020; 03/01/2014. FINDINGS: VASCULAR Aorta: Fusiform aneurysmal dilatation of the abdominal aorta measuring 5.3 x 5.2 cm (axial image 103, series 4; sagittal image 104, series 8). The aneurysm begins approximately 3.6 cm caudal to the take-off of the most inferior left renal artery and extends to the level of the aortic bifurcation. 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 There is a large amount of mixed calcified and noncalcified thrombus throughout the abdominal aorta, not resulting in a hemodynamically significant stenosis. No periaortic stranding. Celiac: There is a minimal amount of mixed calcified and noncalcified atherosclerotic plaque involving the origin the celiac artery, not definitely resulting in hemodynamically significant stenosis. Conventional branching pattern. SMA: There is a large amount of eccentric calcified atherosclerotic plaque involving the origin the SMA which results in at least 50% luminal narrowing (sagittal image 104, series 8). Hemodynamic significance is supported by mild hypertrophy of the GDA and several pancreaticoduodenal arterial collaterals from the celiac artery. Conventional branching pattern. The distal tributaries of the SMA appear widely patent without discrete internal filling defect to suggest distal embolism. Renals:  Solitary bilaterally there is a minimal amount of mixed calcified and noncalcified atherosclerotic plaque involving the origin and proximal aspects of the bilateral renal arteries, not resulting in a hemodynamically significant stenosis. No definitive evidence of FMD. IMA: Remains patent. Inflow: There is a large amount of slightly irregular mixed calcified and noncalcified atherosclerotic plaque involving the bilateral normal caliber common iliac arteries which approaches 50% luminal narrowing bilaterally. The bilateral internal iliac arteries are heavily diseased though patent and of normal caliber. The bilateral external iliac arteries are tortuous though widely patent and of normal caliber. Proximal Outflow: Minimal to moderate amount of mixed calcified and noncalcified atherosclerotic plaque involves the bilateral common femoral arteries as well as the imaged proximal aspects of the bilateral superficial femoral arteries, not resulting in a hemodynamically significant stenosis. The bilateral deep femoral arteries are patent throughout their imaged courses. Veins: The IVC and pelvic venous systems appear widely patent. Review of the MIP images confirms the above findings. _________________________________________________________ _________________________________________________________ NON-VASCULAR Lower chest: Limited visualization of the lower thorax demonstrates minimal bibasilar subsegmental atelectasis. No discrete focal airspace opacities. No pleural effusion. Normal heart size. No pericardial effusion. Hepatobiliary: Normal hepatic contour. There is mild diffuse decreased attenuation of the hepatic parenchyma suggestive of hepatic steatosis. No discrete hepatic lesions. Normal appearance of the gallbladder given degree of distention. No radiopaque gallstones. No intra or extrahepatic biliary ductal dilatation. No ascites. Pancreas: Normal appearance of the pancreas. Spleen: Normal appearance of the  spleen. Adrenals/Urinary Tract: There is symmetric enhancement and excretion of the bilateral kidneys. Note is made of 5 right-sided renal cysts, the largest of which measures 1.4 cm in diameter (image 19, series 11). Additional subcentimeter hypoattenuating right-sided renal lesions are too small to adequately characterize though favored to represent additional renal cysts. No discrete left-sided renal lesions. No urinary obstruction or perinephric stranding. Normal appearance of the bilateral adrenal glands. Normal appearance of the urinary bladder given degree of distention. Stomach/Bowel: Moderate to large colonic stool burden without evidence of enteric obstruction. Scattered colonic diverticulosis without evidence of superimposed acute diverticulitis. Normal appearance of the terminal ileum. The appendix is not visualized compatible with provided operative history. No significant hiatal hernia. No pneumoperitoneum, pneumatosis or portal venous gas. Lymphatic: No bulky retroperitoneal, mesenteric, pelvic or inguinal lymphadenopathy. Reproductive: Normal appearance of  the pelvic organs. No discrete adnexal lesions. No free fluid in the pelvic cul-de-sac. Other: Tiny mesenteric fat containing periumbilical hernia. Musculoskeletal: No acute or aggressive osseous abnormalities. Note is made of partial lumbarization of the S1 vertebral body. Mild degenerative change the bilateral hips with joint space loss, subchondral sclerosis and osteophytosis. There is a peripherally corticated ossicle adjacent to the right greater trochanter. IMPRESSION: VASCULAR 1. Fusiform aneurysmal dilatation of the abdominal aorta measuring 5.3 cm in diameter. If not previously performed, vascular surgery consultation is advised. Otherwise, recommend follow-up CTA of the abdomen and pelvis in 3-6 months. This recommendation follows ACR consensus guidelines: White Paper of the ACR Incidental Findings Committee II on Vascular Findings. J Am  Coll Radiol 2013; 10:789-794. 2. 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 3. Suspected hemodynamically significant narrowing involving the SMA however the celiac and IMA remain patent (though note, the IMA comes off of the aneurysmal component of the abdominal aorta). 4. Irregular mixed calcified and noncalcified atherosclerotic plaque results in approximately 50% luminal narrowing of the bilateral common iliac arteries. NON-VASCULAR 1. Scattered colonic diverticulosis without evidence superimposed acute diverticulitis. 2. Suspected hepatic steatosis.  Correlation with LFTs is advised. These results will be called to the ordering clinician or representative by the Radiologist Assistant, and communication documented in the PACS or [employer]. Electronically Signed   By: Sandi Mariscal M.D.   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  Recommend:  The aneurysm is > 5 cm and therefore should undergo repair. Patient is status post CT scan of the abdominal aorta. The patient is a candidate for endovascular repair.   The patient will require cardiac clearance prior to stent graft placement.   The patient will continue antiplatelet therapy as prescribed (since the patient is undergoing endovascular repair as opposed to open repair) as well as aggressive management of hyperlipidemia. Exercise is again strongly encouraged.   The patient is reminded that lifetime routine surveillance is a necessity with an  endograft.   The risks and benefits of AAA repair are reviewed with the patient.  All questions are answered.  Alternative therapies are also discussed.  The patient agrees to proceed with endovascular aneurysm repair.  Patient will follow-up with me in the office after the surgery.  Benign essential hypertension blood pressure control important in reducing the progression of atherosclerotic disease and aneurysmal growth. On appropriate oral medications.   Type 2 diabetes mellitus without complications (HCC) blood glucose control important in reducing the progression of atherosclerotic disease. Also, involved in wound healing. On appropriate medications.   Mixed hyperlipidemia lipid control important in reducing the progression of atherosclerotic disease. Continue statin therapy      Leotis Pain 11/16/2021, 10:28 AM   This note was created with Dragon medical transcription system.  Any errors from dictation are unintentional.

## 2021-11-16 NOTE — Patient Instructions (Signed)
Abdominal Aortic Aneurysm Endograft Repair, Care After This sheet gives you information about how to care for yourself after your procedure. Your health care provider may also give you more specific instructions. If you have problems or questions, contact your health care provider. What can I expect after the procedure? After the procedure, it is common to have: Pain or soreness at the incision site. Tiredness (fatigue). Follow these instructions at home: Medicines Take over-the-counter and prescription medicines only as told by your health care provider. If you were prescribed an antibiotic medicine, take it as told by your health care provider. Do not stop using the antibiotic even if you start to feel better. If you are taking blood thinners: Talk with your health care provider before you take any medicines that contain aspirin or NSAIDs, such as ibuprofen. These medicines increase your risk for dangerous bleeding. Take your medicine exactly as told, at the same time every day. Avoid activities that could cause injury or bruising, and follow instructions about how to prevent falls. Wear a medical alert bracelet or carry a card that lists what medicines you take. Incision care  Follow instructions from your health care provider about how to take care of your incisions. Make sure you: Wash your hands with soap and water for at least 20 seconds before and after you change your bandage (dressing). If soap and water are not available, use hand sanitizer. Change your dressing as told by your health care provider. Leave stitches (sutures), skin glue, or adhesive strips in place. These skin closures may need to stay in place for 2 weeks or longer. If adhesive strip edges start to loosen and curl up, you may trim the loose edges. Do not remove adhesive strips completely unless your health care provider tells you to do that. Check your incision area every day for signs of infection. Check  for: Redness, swelling, or more pain. Fluid or blood. Warmth. Pus or a bad smell. Keep the incision area clean and dry. Do not take baths, swim, or use a hot tub until your health care provider approves. Ask your health care provider if you may take showers. You may only be allowed to take sponge baths. Activity  Rest as told by your health care provider. Avoid sitting for a long time without moving. Get up to take short walks every 1-2 hours. This is important to improve blood flow and breathing. Ask for help if you feel weak or unsteady. Do not lift anything that is heavier than 10 lb (4.5 kg), or the limit that you are told, until your health care provider says that it is safe. Do not drive until your health care provider says it is okay. Limit your other activities as told. Return to your normal activities as told by your health care provider. Ask your health care provider what activities are safe for you. Lifestyle  Do not use any products that contain nicotine or tobacco, such as cigarettes, e-cigarettes, and chewing tobacco. These can delay incision healing after surgery. If you need help quitting, ask your health care provider. Make any other lifestyle changes that your health care provider suggests. These may include: Keeping your blood pressure under control. Finding ways to lower stress. Eating healthy foods that are good for your heart, such as vegetables, fruits, and whole grains that add fiber to your diet. Getting regular exercise once your health care provider tells you it is safe to do so. General instructions Drink enough fluid to keep your urine   pale yellow. Before any dental work, tell your dentist about the graft in your aorta. You may need to take antibiotics to prevent your graft from getting infected. Keep all follow-up visits as told by your health care provider. This is important. Contact a health care provider if: You have pain in your abdomen, chest, or  back. You have redness or swelling around an incision. You have more pain around an incision. You have fluid or blood coming from an incision. Your incision feels warm to the touch. You have pus or a bad smell coming from an incision. You have a fever. Get help right away if: You have trouble breathing. You suddenly have pain in your legs, or you have trouble moving either of your legs. You faint, or you feel very light-headed. These symptoms may represent a serious problem that is an emergency. Do not wait to see if the symptoms will go away. Get medical help right away. Call your local emergency services (911 in the [place] Do not drive yourself to the hospital. Summary After abdominal aortic aneurysm endograft repair, it is common to feel tired or have pain or soreness at the incision site. Take all medicines as told by your health care provider. If you were given blood thinners, take them exactly as told by your health care provider. Wash your hands before and after caring for your incision. Leave sutures, skin glue, or adhesive strips in place for 2 weeks or longer. Rest as told. Avoid sitting for a long time without moving. Get up to take short walks every 1-2 hours. Let your health care provider know if you have pain in your abdomen, chest, or back. Get help right away if you have trouble breathing or have sudden pain in your legs. This information is not intended to replace advice given to you by your health care provider. Make sure you discuss any questions you have with your health care provider. Document Revised: 05/27/2019 Document Reviewed: 05/27/2019 Elsevier Patient Education  2023 Elsevier Inc[employer]  

## 2021-11-16 NOTE — Assessment & Plan Note (Signed)
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  Recommend:  The aneurysm is > 5 cm and therefore should undergo repair. Patient is status post CT scan of the abdominal aorta. The patient is a candidate for endovascular repair.   The patient will require cardiac clearance prior to stent graft placement.   The patient will continue antiplatelet therapy as prescribed (since the patient is undergoing endovascular repair as opposed to open repair) as well as aggressive management of hyperlipidemia. Exercise is again strongly encouraged.   The patient is reminded that lifetime routine surveillance is a necessity with an endograft.   The risks and benefits of AAA repair are reviewed with the patient.  All questions are answered.  Alternative therapies are also discussed.  The patient agrees to proceed with endovascular aneurysm repair.  Patient will follow-up with me in the office after the surgery.

## 2021-11-16 NOTE — Assessment & Plan Note (Signed)
lipid control important in reducing the progression of atherosclerotic disease. Continue statin therapy  

## 2021-12-14 ENCOUNTER — Ambulatory Visit: Payer: Medicare Other | Admitting: Cardiology

## 2021-12-14 ENCOUNTER — Encounter: Payer: Self-pay | Admitting: Cardiology

## 2021-12-14 VITALS — BP 140/82 | HR 90 | Ht 64.0 in | Wt 153.0 lb

## 2021-12-14 DIAGNOSIS — Z01810 Encounter for preprocedural cardiovascular examination: Secondary | ICD-10-CM

## 2021-12-14 DIAGNOSIS — Z13220 Encounter for screening for lipoid disorders: Secondary | ICD-10-CM

## 2021-12-14 DIAGNOSIS — I2510 Atherosclerotic heart disease of native coronary artery without angina pectoris: Secondary | ICD-10-CM | POA: Diagnosis not present

## 2021-12-14 DIAGNOSIS — F17200 Nicotine dependence, unspecified, uncomplicated: Secondary | ICD-10-CM | POA: Diagnosis not present

## 2021-12-14 DIAGNOSIS — I10 Essential (primary) hypertension: Secondary | ICD-10-CM | POA: Diagnosis not present

## 2021-12-14 DIAGNOSIS — J439 Emphysema, unspecified: Secondary | ICD-10-CM

## 2021-12-14 DIAGNOSIS — I7143 Infrarenal abdominal aortic aneurysm, without rupture: Secondary | ICD-10-CM

## 2021-12-19 ENCOUNTER — Ambulatory Visit (INDEPENDENT_AMBULATORY_CARE_PROVIDER_SITE_OTHER): Payer: Medicare Other

## 2021-12-19 DIAGNOSIS — I2510 Atherosclerotic heart disease of native coronary artery without angina pectoris: Secondary | ICD-10-CM | POA: Diagnosis not present

## 2021-12-19 LAB — ECHOCARDIOGRAM COMPLETE
Area-P 1/2: 3.21 cm2
MV VTI: 2.47 cm2
S' Lateral: 2.5 cm

## 2021-12-20 ENCOUNTER — Encounter
Admission: RE | Admit: 2021-12-20 | Discharge: 2021-12-20 | Disposition: A | Discharge status: Home / Self Care | Payer: Medicare Other | Source: Ambulatory Visit | Attending: Cardiology | Admitting: Cardiology

## 2021-12-20 ENCOUNTER — Telehealth: Payer: Self-pay

## 2021-12-20 DIAGNOSIS — I2510 Atherosclerotic heart disease of native coronary artery without angina pectoris: Secondary | ICD-10-CM | POA: Diagnosis present

## 2021-12-20 LAB — NM MYOCAR MULTI W/SPECT W/WALL MOTION / EF
LV dias vol: 17 mL (ref 46–106)
LV sys vol: 6 mL
Nuc Stress EF: 65 %
Peak HR: 104 {beats}/min
Percent HR: 70 %
Rest HR: 80 {beats}/min
Rest Nuclear Isotope Dose: 10.4 mCi
SDS: 0
SRS: 3
SSS: 0
ST Depression (mm): 0 mm
Stress Nuclear Isotope Dose: 30.7 mCi
TID: 1

## 2021-12-20 MED ORDER — TECHNETIUM TC 99M TETROFOSMIN IV KIT
10.4300 | PACK | Freq: Once | INTRAVENOUS | Status: AC | PRN
  Administered 2021-12-20: 10.43 via INTRAVENOUS

## 2021-12-20 MED ORDER — REGADENOSON 0.4 MG/5ML IV SOLN
0.4000 mg | Freq: Once | INTRAVENOUS | Status: AC
  Administered 2021-12-20: 0.4 mg via INTRAVENOUS

## 2021-12-20 MED ORDER — TECHNETIUM TC 99M TETROFOSMIN IV KIT
30.6800 | PACK | Freq: Once | INTRAVENOUS | Status: AC | PRN
  Administered 2021-12-20: 30.68 via INTRAVENOUS

## 2021-12-20 NOTE — Telephone Encounter (Signed)
Left a VM for patient requesting a call back.

## 2021-12-20 NOTE — Telephone Encounter (Signed)
-----   Message from Kate Sable, MD sent at 12/19/2021  5:52 PM EDT ----- Echocardiogram shows normal systolic and diastolic function.  No gross structural abnormalities noted .perform stress test as scheduled.

## 2021-12-24 ENCOUNTER — Telehealth: Payer: Self-pay | Admitting: Cardiology

## 2021-12-24 ENCOUNTER — Encounter (HOSPITAL_COMMUNITY): Payer: Self-pay | Admitting: Radiology

## 2021-12-24 NOTE — Telephone Encounter (Signed)
Called patient and let her know that she can call the Dr. Bunnie Domino office and let them know that all of her pre-op cardiac testing has been done so that they can submit for the cardiac clearance to get her surgery approved and scheduled. Patient verbalized understanding and agreed with plan.

## 2021-12-24 NOTE — Telephone Encounter (Signed)
Patient called to get results from her stress test

## 2021-12-24 NOTE — Telephone Encounter (Signed)
The patient has been notified of the result and verbalized understanding.  All questions (if any) were answered. See tele encounter from 12/24/21 Kavin Leech, RN 12/24/2021 3:24 PM

## 2021-12-24 NOTE — Telephone Encounter (Signed)
Reviewed results with patient and she did inquire if provider provided clearance. Advised I would send over to his nurse in regards to clearance but her results were normal. She verbalized understanding with no further questions at this time.

## 2022-01-01 ENCOUNTER — Telehealth (INDEPENDENT_AMBULATORY_CARE_PROVIDER_SITE_OTHER): Payer: Self-pay

## 2022-01-01 NOTE — Telephone Encounter (Signed)
I attempted to contact the patient to schedule her for a AAA stent graft repair and a message was left for a return call.

## 2022-01-10 ENCOUNTER — Telehealth (INDEPENDENT_AMBULATORY_CARE_PROVIDER_SITE_OTHER): Payer: Self-pay

## 2022-01-10 NOTE — Telephone Encounter (Addendum)
I attempted to contact the patient regarding her surgery with Dr. Lucky Cowboy on 01/14/22. A message was left for a return call. I spoke with  the patient and she is scheduled with Dr. Lucky Cowboy on 01/14/22 with a 6:45 am arrival time to the [facility]. Pre-op phone call is on 01/11/22 between 8-1 pm. Pre-surgical instructions were discussed and patient stated she wrote them down as well.

## 2022-01-11 ENCOUNTER — Encounter
Admission: RE | Admit: 2022-01-11 | Discharge: 2022-01-11 | Disposition: A | Discharge status: Home / Self Care | Payer: Medicare Other | Source: Ambulatory Visit | Attending: Vascular Surgery | Admitting: Vascular Surgery

## 2022-01-11 ENCOUNTER — Telehealth: Payer: Self-pay | Admitting: *Deleted

## 2022-01-11 ENCOUNTER — Other Ambulatory Visit (INDEPENDENT_AMBULATORY_CARE_PROVIDER_SITE_OTHER): Payer: Self-pay | Admitting: Nurse Practitioner

## 2022-01-11 ENCOUNTER — Encounter: Payer: Self-pay | Admitting: Vascular Surgery

## 2022-01-11 DIAGNOSIS — I7143 Infrarenal abdominal aortic aneurysm, without rupture: Secondary | ICD-10-CM

## 2022-01-11 DIAGNOSIS — Z01812 Encounter for preprocedural laboratory examination: Secondary | ICD-10-CM | POA: Insufficient documentation

## 2022-01-11 DIAGNOSIS — E119 Type 2 diabetes mellitus without complications: Secondary | ICD-10-CM

## 2022-01-11 DIAGNOSIS — Z01818 Encounter for other preprocedural examination: Secondary | ICD-10-CM

## 2022-01-11 HISTORY — DX: Tobacco use: Z72.0

## 2022-01-11 HISTORY — DX: Mixed hyperlipidemia: E78.2

## 2022-01-11 HISTORY — DX: Gastro-esophageal reflux disease without esophagitis: K21.9

## 2022-01-11 HISTORY — DX: Atherosclerotic heart disease of native coronary artery without angina pectoris: I25.10

## 2022-01-11 HISTORY — DX: Unspecified right bundle-branch block: I45.10

## 2022-01-11 HISTORY — DX: Chronic obstructive pulmonary disease, unspecified: J44.9

## 2022-01-11 LAB — CBC
HCT: 44.3 % (ref 36.0–46.0)
Hemoglobin: 14.2 g/dL (ref 12.0–15.0)
MCH: 30.1 pg (ref 26.0–34.0)
MCHC: 32.1 g/dL (ref 30.0–36.0)
MCV: 93.9 fL (ref 80.0–100.0)
Platelets: 249 10*3/uL (ref 150–400)
RBC: 4.72 MIL/uL (ref 3.87–5.11)
RDW: 12.4 % (ref 11.5–15.5)
WBC: 8.1 10*3/uL (ref 4.0–10.5)
nRBC: 0 % (ref 0.0–0.2)

## 2022-01-11 LAB — BASIC METABOLIC PANEL
Anion gap: 7 (ref 5–15)
BUN: 10 mg/dL (ref 8–23)
CO2: 29 mmol/L (ref 22–32)
Calcium: 9.5 mg/dL (ref 8.9–10.3)
Chloride: 101 mmol/L (ref 98–111)
Creatinine, Ser: 1.08 mg/dL — ABNORMAL HIGH (ref 0.44–1.00)
GFR, Estimated: 54 mL/min — ABNORMAL LOW (ref >60)
Glucose, Bld: 265 mg/dL — ABNORMAL HIGH (ref 70–99)
Potassium: 5 mmol/L (ref 3.5–5.1)
Sodium: 137 mmol/L (ref 135–145)

## 2022-01-11 LAB — TYPE AND SCREEN
ABO/RH(D): O POS
Antibody Screen: NEGATIVE

## 2022-01-11 NOTE — Progress Notes (Signed)
Perioperative Services  Pre-Admission/Anesthesia Testing Clinical Review  Date: 01/11/22  Patient Demographics:  Name: Kristin Hughes DOB:   [birth year] MRN:   [id number]  Planned Surgical Procedure(s):    Case: [id number] Date/Time: 01/14/22 0800   Procedure: ENDOVASCULAR REPAIR/STENT GRAFT   Anesthesia type: Moderate Sedation   Diagnosis: Abdominal aortic aneurysm (AAA) without rupture, unspecified part ([place]) [I71.40]   Pre-op diagnosis:      Endovascular AAA Repair  GORE   AAA     Dr Delana Meyer to assist   Location: [facility] [place] [facility]   Providers: Algernon Huxley, MD   NOTE: Available PAT nursing documentation and vital signs have been reviewed. Clinical nursing staff has updated patient's PMH/PSHx, current medication list, and drug allergies/intolerances to ensure comprehensive history available to assist in medical decision making as it pertains to the aforementioned surgical procedure and anticipated anesthetic course. Extensive review of available clinical information performed. [place] [facility] and PSHx updated with any diagnoses/procedures that  may have been inadvertently omitted during her intake with the pre-admission testing department's nursing staff.  Clinical Discussion:  Kristin Hughes is a 71 y.o. female who is submitted for pre-surgical anesthesia review and clearance prior to her undergoing the above procedure.Patient is a Current Smoker (45 pack years). Pertinent PMH includes: CAD, aortic atherosclerosis, AAA, RBBB, HTN, HLD, T2DM, GERD (no daily Tx), COPD, LUL pulmonary nodule.  Patient is followed by cardiology Garen Lah, MD). She was last seen in the cardiology clinic on 12/14/2021; notes reviewed.  At the time of her visit, patient was establishing care with local cardiologist.  Patient denied any episodes of chest pain, however complained of shortness of breath that was exacerbated by her smoking.  Of note, patient with a COPD diagnosis and LUL pulmonary  nodule.  She denies any PND, orthopnea, palpitations, significant peripheral edema, vertiginous symptoms, or presyncope/syncope.  Patient with a past medical history significant for cardiovascular diagnoses.  CTA of the abdomen pelvis performed on 11/09/2021 revealed fusiform aneurysmal dilatation of the abdominal aorta measuring 5.3 x 5.2 cm.  There was suspected hemodynamically significant narrowing involving the SMA, however the celiac and IMA remain patent.  There was a regular noncalcified atherosclerotic plaque causing approximately 50% luminal narrowing in the BILATERAL common iliac arteries.  Blood pressure mildly elevated 140/82 on currently prescribed CCB and ACEi therapies.  Patient is on a statin + omega-3 fatty acid for her HLD.  T2DM diet controlled; last HgbA1c 6.7% when checked on 11/08/2021.  Functional capacity somewhat limited by dyspnea, however dyspnea felt to be related to ongoing smoking history and underlying COPD diagnosis.  She is felt to be able to achieve at least 4 METS of activity without anginal/anginal equivalent symptoms.  No changes were made to her medication regimen.  Patient is scheduled for upcoming AAA repair in the near future.  In efforts to further restratify patient, noninvasive cardiovascular studies (MPI + TTE) were ordered.  Patient to follow-up with outpatient cardiology following her cardiovascular studies to discuss results and ongoing care.  Since being seen initially by cardiology, patient has undergone the ordered studies.  TTE performed on 12/19/2021 revealed a normal left ventricular systolic function with mild LVH; LVEF 60-65%.  There was trivial mitral valve regurgitation.  Aortic valve was sclerotic with calcifications noted, however there was no evidence of aortic valve stenosis.  Subsequent myocardial perfusion imaging study was performed on 12/20/2021 revealing a hyperdynamic left ventricular systolic function with an EF of >75%.  There was no  evidence  of stress-induced myocardial ischemia or arrhythmia.  There were coronary calcifications noted in the LAD and LCx territories, however there was no evidence of significant ischemia noted.  Study determined to be low risk overall.  Kristin Hughes is scheduled for an ENDOVASCULAR REPAIR/STENT GRAFT of her recently discovered AAA on 01/14/2022 with Dr. Leotis Pain, MD.  Given patient's past medical history significant for cardiovascular diagnoses, presurgical cardiac clearance was sought by the performing surgeon's office and PAT team. Per cardiology, "based ACC/AHA guidelines, the patient's past medical history, and the amount of time since her last clinic visit, this patient would be at an overall ACCEPTABLE risk for the planned procedure without further cardiovascular testing or intervention at this time". In review of her medication reconciliation, the patient is not noted to be taking any type of anticoagulation or antiplatelet therapies that would need to be held during her perioperative course.  Patient denies previous perioperative complications with anesthesia in the past.  In review her EMR, there are no records available for review pertaining to past procedural/anesthetic courses within the [facility] system.     12/14/2021    8:30 AM 12/14/2021    8:25 AM 11/16/2021    9:40 AM  Vitals with BMI  Height  '5\' 4"'$  '5\' 4"'$   Weight  153 lbs 153 lbs 13 oz  BMI  02.63 78.58  Systolic 850 277 412  Diastolic 82 80 78  Pulse  90 94    Providers/Specialists:   NOTE: Primary physician provider listed below. Patient may have been seen by APP or partner within same practice.   PROVIDER ROLE / SPECIALTY LAST Imelda Pillow, MD Vascular Surgery (Surgeon) 11/16/2021  Joyice Faster, [place] Primary Care Provider 12/13/2021  Kate Sable, MD Cardiology 12/14/2021   Allergies:  Other and Orange oil  Current Home Medications:   No current facility-administered medications for this  encounter.    amLODipine (NORVASC) 5 MG tablet   aspirin EC 81 MG tablet   atorvastatin (LIPITOR) 40 MG tablet   cetirizine (ZYRTEC) 10 MG tablet   CINNAMON PO   glimepiride (AMARYL) 4 MG tablet   Ibuprofen-diphenhydrAMINE Cit 200-38 MG TABS   LEVEMIR FLEXTOUCH 100 UNIT/ML Pen   lisinopril (PRINIVIL,ZESTRIL) 40 MG tablet   metFORMIN (GLUCOPHAGE) 500 MG tablet   Multiple Vitamin (MULTIVITAMIN) tablet   Omega-3 Fatty Acids (FISH OIL) 1200 MG CAPS   History:   Past Medical History:  Diagnosis Date   AAA (abdominal aortic aneurysm) ([place]) 11/09/2021   a.) CTA abd/pelvis 11/09/2021: 5.3 x 5.2 cm fusiform AAA   Aortic atherosclerosis ([facility])    COPD (chronic obstructive pulmonary disease) (HCC)    Coronary artery disease    a.) Lexi 12/20/2021: EF >75%, LAD and LCx calcifications; no ischemia.   Diverticulosis    GERD (gastroesophageal reflux disease)    Hypertension    Left upper lobe pulmonary nodule 11/29/2020   a.) LDCT 11/29/2020: 4.1 mm subpleural LUL nodule.   Mixed hyperlipidemia    RBBB    Smokers' cough ([place])    Tobacco use    Type 2 diabetes mellitus treated with insulin [facility])    Past Surgical History:  Procedure Laterality Date   APPENDECTOMY  1970   COLONOSCOPY N/A 02/19/2017   Procedure: COLONOSCOPY;  Surgeon: Daneil Dolin, MD;  Location: [facility];  Service: Endoscopy;  Laterality: N/A;  8:15 am   TUBAL LIGATION     Family History  Problem Relation Age of Onset  Colon cancer Father    Social History   Tobacco Use   Smoking status: Every Day    Packs/day: 1.00    Years: 45.00    Total pack years: 45.00    Types: Cigarettes   Smokeless tobacco: Never  Vaping Use   Vaping Use: Never used  Substance Use Topics   Alcohol use: No   Drug use: No    Pertinent Clinical Results:  LABS: Labs reviewed: Acceptable for surgery.  Hospital Outpatient Visit on 01/11/2022  Component Date Value Ref Range Status   WBC 01/11/2022 8.1  4.0 - 10.5 K/uL Final    RBC 01/11/2022 4.72  3.87 - 5.11 MIL/uL Final   Hemoglobin 01/11/2022 14.2  12.0 - 15.0 g/dL Final   HCT 01/11/2022 44.3  36.0 - 46.0 % Final   MCV 01/11/2022 93.9  80.0 - 100.0 fL Final   MCH 01/11/2022 30.1  26.0 - 34.0 pg Final   MCHC 01/11/2022 32.1  30.0 - 36.0 g/dL Final   RDW 01/11/2022 12.4  11.5 - 15.5 % Final   Platelets 01/11/2022 249  150 - 400 K/uL Final   nRBC 01/11/2022 0.0  0.0 - 0.2 % Final   Performed at [facility], [place], [place] [address]   Sodium 01/11/2022 137  135 - 145 mmol/L Final   Potassium 01/11/2022 5.0  3.5 - 5.1 mmol/L Final   Chloride 01/11/2022 101  98 - 111 mmol/L Final   CO2 01/11/2022 29  22 - 32 mmol/L Final   Glucose, Bld 01/11/2022 265 (H)  70 - 99 mg/dL Final   Glucose reference range applies only to samples taken after fasting for at least 8 hours.   BUN 01/11/2022 10  8 - 23 mg/dL Final   Creatinine, Ser 01/11/2022 1.08 (H)  0.44 - 1.00 mg/dL Final   Calcium 01/11/2022 9.5  8.9 - 10.3 mg/dL Final   GFR, Estimated 01/11/2022 54 (L)  >60 mL/min Final   Comment: (NOTE) Calculated using the CKD-EPI Creatinine Equation (2021)    Anion gap 01/11/2022 7  5 - 15 Final   Performed at [facility], [address] [place], [place] [address]    ECG: Date: 12/14/2021 Time ECG obtained: 0827 AM Rate: 90 bpm Rhythm:  Sinus rhythm with short PR; RBBB Axis (leads I and aVF): Left axis deviation Intervals: PR 104 ms. QRS 144 ms. QTc 479 ms. ST segment and T wave changes: No evidence of acute ST segment elevation or depression Comparison: No previous tracings available for review and comparison.    IMAGING / PROCEDURES: MYOCARDIAL PERFUSION IMAGING STUDY (LEXISCAN) performed on 12/20/2021 Hyperdynamic left ventricular systolic function with an EF of >75% LAD and LCx coronary calcifications noted No evidence of stress-induced myocardial ischemia or arrhythmia Low risk study  TRANSTHORACIC ECHOCARDIOGRAM  performed on 12/11/2021 Left ventricular ejection fraction, by estimation, is 60 to 65%. The left ventricle has normal function. The left ventricle has no regional wall motion abnormalities. There is mild left ventricular hypertrophy. Left ventricular diastolic parameters were normal.  Right ventricular systolic function is normal. The right ventricular size is normal. There is normal pulmonary artery systolic pressure. The mitral valve is degenerative. Trivial mitral valve regurgitation. The aortic valve was not well visualized. Aortic valve regurgitation is not visualized. Aortic valve sclerosis/calcification is present, without any evidence of aortic stenosis.  The inferior vena cava is normal in size with greater than 50% respiratory variability, suggesting right atrial pressure of 3 mmHg.  CT ANGIO ABD/PEL W/ AND/OR W/O performed on 11/09/2021 Fusiform aneurysmal dilatation of the abdominal aorta measuring 5.3 cm in diameter. If not previously performed, vascular surgery consultation is advised. Otherwise, recommend follow-up CTA of the abdomen and pelvis in 3-6 months. 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 Suspected hemodynamically significant narrowing involving the SMA however the celiac and IMA remain patent (though note, the IMA comes off of the aneurysmal component of the abdominal aorta). Irregular mixed calcified and noncalcified atherosclerotic plaque results in approximately 50% luminal narrowing of the bilateral common iliac arteries. Scattered colonic diverticulosis without evidence superimposed acute diverticulitis. Suspected hepatic steatosis.  Correlation with LFTs is advised  Impression and Plan:  Kristin Hughes has been referred for pre-anesthesia review and clearance prior to her undergoing the planned anesthetic and procedural courses. Available labs, pertinent testing, and  imaging results were personally reviewed by me. This patient has been appropriately cleared by cardiology with an overall ACCEPTABLE risk of significant perioperative cardiovascular complications.  Based on clinical review performed today (01/11/22), barring any significant acute changes in the patient's overall condition, it is anticipated that she will be able to proceed with the planned surgical intervention. Any acute changes in clinical condition may necessitate her procedure being postponed and/or cancelled. Patient will meet with anesthesia team (MD and/or CRNA) on the day of her procedure for preoperative evaluation/assessment. Questions regarding anesthetic course will be fielded at that time.   Pre-surgical instructions were reviewed with the patient during her PAT appointment and questions were fielded by PAT clinical staff. Patient was advised that if any questions or concerns arise prior to her procedure then she should return a call to PAT and/or her surgeon's office to discuss.  Honor Loh, MSN, APRN, FNP-C, CEN [facility]  Peri-operative Services Nurse Practitioner Phone: [phone] Fax: [phone] 01/11/22 2:59 PM  NOTE: This note has been prepared using Dragon dictation software. Despite my best ability to proofread, there is always the potential that unintentional transcriptional errors may still occur from this process.

## 2022-01-11 NOTE — Telephone Encounter (Signed)
   Primary Cardiologist: Kate Sable, MD  Chart reviewed as part of pre-operative protocol coverage. Given past medical history and time since last visit, based on ACC/AHA guidelines, Rian Koon would be at acceptable risk for the planned procedure without further cardiovascular testing.   I will route this recommendation to the requesting party via Epic fax function and remove from pre-op pool.  Please call with questions.  Emmaline Life, NP-C    01/11/2022, 10:33 AM [place] [address] Office [phone] Fax [phone]

## 2022-01-11 NOTE — Patient Instructions (Signed)
Your procedure is scheduled on:01-14-22 Monday Report to the Registration Desk on the 1st floor of the [facility] @ 6:45 am  REMEMBER: Instructions that are not followed completely may result in serious medical risk, up to and including death; or upon the discretion of your surgeon and anesthesiologist your surgery may need to be rescheduled.  Do not eat food OR drink any liquids after midnight the night before surgery.  No gum chewing, lozengers or hard candies.  TAKE THESE MEDICATIONS THE MORNING OF SURGERY WITH A SIP OF WATER: -amLODipine (NORVASC)  -atorvastatin (LIPITOR) -cetirizine (ZYRTEC)   Continue your 81 mg Aspirin up until the day prior to surgery-Do NOT take the day of surgery  Take half of your LEVEMIR Insulin the night before your surgery (13 units) and NO Insulin the day of surgery  Stop your metFORMIN (GLUCOPHAGE) 2 days prior to surgery-Your last dose will be 01-11-22 Friday  One week prior to surgery: Stop Anti-inflammatories (NSAIDS) such as Advil, Aleve, Ibuprofen, Motrin, Naproxen, Naprosyn and Aspirin based products such as Excedrin, Goodys Powder, BC Powder.You may however, continue to take Tylenol if needed for pain up until the day of surgery.  Stop ANY OVER THE COUNTER supplements/vitamins NOW (01-11-22) until after surgery (multitvitamin, fish oil and cinnamon)  No Alcohol for 24 hours before or after surgery.  No Smoking including e-cigarettes for 24 hours prior to surgery.  No chewable tobacco products for at least 6 hours prior to surgery.  No nicotine patches on the day of surgery.  Do not use any "recreational" drugs for at least a week prior to your surgery.  Please be advised that the combination of cocaine and anesthesia may have negative outcomes, up to and including death. If you test positive for cocaine, your surgery will be cancelled.  On the morning of surgery brush your teeth with toothpaste and water, you may rinse your mouth with  mouthwash if you wish. Do not swallow any toothpaste or mouthwash.  Use CHG Soap as directed on instruction sheet.  Do not wear jewelry, make-up, hairpins, clips or nail polish.  Do not wear lotions, powders, or perfumes.   Do not shave body from the neck down 48 hours prior to surgery just in case you cut yourself which could leave a site for infection.  Also, freshly shaved skin may become irritated if using the CHG soap.  Contact lenses, hearing aids and dentures may not be worn into surgery.  Do not bring valuables to the hospital. [facility] is not responsible for any missing/lost belongings or valuables.   Notify your doctor if there is any change in your medical condition (cold, fever, infection).  Wear comfortable clothing (specific to your surgery type) to the hospital.  After surgery, you can help prevent lung complications by doing breathing exercises.  Take deep breaths and cough every 1-2 hours. Your doctor may order a device called an Incentive Spirometer to help you take deep breaths. When coughing or sneezing, hold a pillow firmly against your incision with both hands. This is called "splinting." Doing this helps protect your incision. It also decreases belly discomfort.  If you are being admitted to the hospital overnight, leave your suitcase in the car. After surgery it may be brought to your room.  If you are being discharged the day of surgery, you will not be allowed to drive home. You will need a responsible adult (18 years or older) to drive you home and stay with you that night.  If you are taking public transportation, you will need to have a responsible adult (18 years or older) with you. Please confirm with your physician that it is acceptable to use public transportation.   Please call the [place] Dept. at [phone] if you have any questions about these instructions.  Surgery Visitation Policy:  Patients undergoing a surgery or  procedure may have two family members or support persons with them as long as the person is not COVID-19 positive or experiencing its symptoms.   Inpatient Visitation:    Visiting hours are 7 a.m. to 8 p.m. Up to four visitors are allowed at one time in a patient room, including children. The visitors may rotate out with other people during the day. One designated support person (adult) may remain overnight.

## 2022-01-11 NOTE — Telephone Encounter (Signed)
Request for pre-operative cardiac clearance Received: Today Karen Kitchens, NP  P Cv Div Preop Callback Request for pre-operative cardiac clearance:     1. What type of surgery is being performed?  ENDOVASCULAR REPAIR/STENT GRAFT   2. When is this surgery scheduled?  01/14/2022     3. Type of clearance being requested (medical, pharmacy, both).  MEDICAL     4. Are there any medications that need to be held prior to surgery?  NONE   5. Practice name and name of physician performing surgery?  Performing surgeon: Dr. Leotis Pain, MD  Requesting clearance: Honor Loh, FNP-C       6. Anesthesia type (none, local, MAC, general)?  GENERAL   7. What is the office phone and fax number?    Phone: [phone]  Fax: [phone]   ATTENTION: Unable to create telephone message as per your standard workflow. Directed by [facility] providers to send requests for cardiac clearance to this pool for appropriate distribution to provider covering pre-operative clearances.   Honor Loh, MSN, APRN, FNP-C, CEN  [facility]  Peri-operative Services Nurse Practitioner  Phone: [phone]  01/11/22 10:13 AM

## 2022-01-14 ENCOUNTER — Inpatient Hospital Stay: Payer: Medicare Other | Admitting: Urgent Care

## 2022-01-14 ENCOUNTER — Encounter: Payer: Self-pay | Admitting: Vascular Surgery

## 2022-01-14 ENCOUNTER — Inpatient Hospital Stay
Admission: RE | Admit: 2022-01-14 | Discharge: 2022-01-15 | DRG: 269 | Disposition: A | Discharge status: Home / Self Care | Payer: Medicare Other | Attending: Vascular Surgery | Admitting: Vascular Surgery

## 2022-01-14 ENCOUNTER — Encounter
Admission: RE | Disposition: A | Discharge status: Home / Self Care | Payer: Self-pay | Source: Home / Self Care | Attending: Vascular Surgery

## 2022-01-14 DIAGNOSIS — I745 Embolism and thrombosis of iliac artery: Secondary | ICD-10-CM | POA: Diagnosis present

## 2022-01-14 DIAGNOSIS — Z800 Family history of malignant neoplasm of digestive organs: Secondary | ICD-10-CM

## 2022-01-14 DIAGNOSIS — R911 Solitary pulmonary nodule: Secondary | ICD-10-CM | POA: Diagnosis present

## 2022-01-14 DIAGNOSIS — I708 Atherosclerosis of other arteries: Secondary | ICD-10-CM | POA: Diagnosis present

## 2022-01-14 DIAGNOSIS — I7143 Infrarenal abdominal aortic aneurysm, without rupture: Principal | ICD-10-CM | POA: Diagnosis present

## 2022-01-14 DIAGNOSIS — Z7982 Long term (current) use of aspirin: Secondary | ICD-10-CM | POA: Diagnosis not present

## 2022-01-14 DIAGNOSIS — I700 Atherosclerosis of aorta: Secondary | ICD-10-CM | POA: Diagnosis present

## 2022-01-14 DIAGNOSIS — E782 Mixed hyperlipidemia: Secondary | ICD-10-CM | POA: Diagnosis present

## 2022-01-14 DIAGNOSIS — I714 Abdominal aortic aneurysm, without rupture, unspecified: Secondary | ICD-10-CM | POA: Diagnosis not present

## 2022-01-14 DIAGNOSIS — F17210 Nicotine dependence, cigarettes, uncomplicated: Secondary | ICD-10-CM | POA: Diagnosis present

## 2022-01-14 DIAGNOSIS — Z7984 Long term (current) use of oral hypoglycemic drugs: Secondary | ICD-10-CM

## 2022-01-14 DIAGNOSIS — J449 Chronic obstructive pulmonary disease, unspecified: Secondary | ICD-10-CM | POA: Diagnosis present

## 2022-01-14 DIAGNOSIS — Z01818 Encounter for other preprocedural examination: Secondary | ICD-10-CM

## 2022-01-14 DIAGNOSIS — E119 Type 2 diabetes mellitus without complications: Secondary | ICD-10-CM | POA: Diagnosis present

## 2022-01-14 DIAGNOSIS — Z91018 Allergy to other foods: Secondary | ICD-10-CM

## 2022-01-14 DIAGNOSIS — K219 Gastro-esophageal reflux disease without esophagitis: Secondary | ICD-10-CM | POA: Diagnosis present

## 2022-01-14 DIAGNOSIS — I4510 Unspecified right bundle-branch block: Secondary | ICD-10-CM | POA: Diagnosis present

## 2022-01-14 DIAGNOSIS — I10 Essential (primary) hypertension: Secondary | ICD-10-CM | POA: Diagnosis present

## 2022-01-14 DIAGNOSIS — I2510 Atherosclerotic heart disease of native coronary artery without angina pectoris: Secondary | ICD-10-CM | POA: Diagnosis present

## 2022-01-14 DIAGNOSIS — Z7902 Long term (current) use of antithrombotics/antiplatelets: Secondary | ICD-10-CM | POA: Diagnosis not present

## 2022-01-14 DIAGNOSIS — Z794 Long term (current) use of insulin: Secondary | ICD-10-CM | POA: Diagnosis not present

## 2022-01-14 DIAGNOSIS — Z79899 Other long term (current) drug therapy: Secondary | ICD-10-CM

## 2022-01-14 HISTORY — DX: Diverticulosis of intestine, part unspecified, without perforation or abscess without bleeding: K57.90

## 2022-01-14 HISTORY — PX: ENDOVASCULAR REPAIR/STENT GRAFT: CATH118280

## 2022-01-14 HISTORY — DX: Simple chronic bronchitis: J41.0

## 2022-01-14 HISTORY — DX: Type 2 diabetes mellitus without complications: E11.9

## 2022-01-14 HISTORY — DX: Long term (current) use of insulin: Z79.4

## 2022-01-14 HISTORY — DX: Atherosclerosis of aorta: I70.0

## 2022-01-14 LAB — GLUCOSE, CAPILLARY
Glucose-Capillary: 149 mg/dL — ABNORMAL HIGH (ref 70–99)
Glucose-Capillary: 180 mg/dL — ABNORMAL HIGH (ref 70–99)
Glucose-Capillary: 164 mg/dL — ABNORMAL HIGH (ref 70–99)
Glucose-Capillary: 269 mg/dL — ABNORMAL HIGH (ref 70–99)

## 2022-01-14 LAB — MRSA NEXT GEN BY PCR, NASAL: MRSA by PCR Next Gen: NOT DETECTED

## 2022-01-14 SURGERY — ENDOVASCULAR STENT GRAFT (AAA)
Anesthesia: General

## 2022-01-14 MED ORDER — CHLORHEXIDINE GLUCONATE 0.12 % MT SOLN: 15.0000 mL | Freq: Once | OROMUCOSAL | Status: DC

## 2022-01-14 MED ORDER — ASPIRIN 81 MG PO TBEC
81.0000 mg | DELAYED_RELEASE_TABLET | Freq: Every day | ORAL | Status: DC
  Administered 2022-01-15: 81 mg via ORAL
  Filled 2022-01-14: qty 1

## 2022-01-14 MED ORDER — ACETAMINOPHEN 325 MG PO TABS
325.0000 mg | ORAL_TABLET | ORAL | Status: DC | PRN
  Administered 2022-01-14: 650 mg via ORAL
  Filled 2022-01-14: qty 2

## 2022-01-14 MED ORDER — MIDAZOLAM HCL 2 MG/2ML IJ SOLN
INTRAMUSCULAR | Status: DC | PRN
  Administered 2022-01-14: 1 mg via INTRAVENOUS

## 2022-01-14 MED ORDER — FENTANYL CITRATE (PF) 100 MCG/2ML IJ SOLN
INTRAMUSCULAR | Status: DC | PRN
Start: 2022-01-14 — End: 2022-01-14
  Administered 2022-01-14: 50 ug via INTRAVENOUS
  Administered 2022-01-14: 25 ug via INTRAVENOUS
  Administered 2022-01-14: 50 ug via INTRAVENOUS

## 2022-01-14 MED ORDER — SODIUM CHLORIDE 0.9 % IV SOLN: 500.0000 mL | Freq: Once | INTRAVENOUS | Status: DC | PRN

## 2022-01-14 MED ORDER — HYDRALAZINE HCL 20 MG/ML IJ SOLN: 5.0000 mg | INTRAMUSCULAR | Status: DC | PRN

## 2022-01-14 MED ORDER — HEPARIN SODIUM (PORCINE) 1000 UNIT/ML IJ SOLN
INTRAMUSCULAR | Status: DC | PRN
  Administered 2022-01-14: 5000 [IU] via INTRAVENOUS

## 2022-01-14 MED ORDER — LIDOCAINE HCL (CARDIAC) PF 100 MG/5ML IV SOSY
PREFILLED_SYRINGE | INTRAVENOUS | Status: DC | PRN
  Administered 2022-01-14: 80 mg via INTRAVENOUS

## 2022-01-14 MED ORDER — IODIXANOL 320 MG/ML IV SOLN
INTRAVENOUS | Status: DC | PRN
  Administered 2022-01-14: 75 mL via INTRA_ARTERIAL

## 2022-01-14 MED ORDER — ROCURONIUM BROMIDE 100 MG/10ML IV SOLN
INTRAVENOUS | Status: DC | PRN
  Administered 2022-01-14: 40 mg via INTRAVENOUS

## 2022-01-14 MED ORDER — LORATADINE 10 MG PO TABS
10.0000 mg | ORAL_TABLET | Freq: Every day | ORAL | Status: DC
  Administered 2022-01-15: 10 mg via ORAL
  Filled 2022-01-14: qty 1

## 2022-01-14 MED ORDER — HEPARIN SODIUM (PORCINE) 1000 UNIT/ML IJ SOLN
INTRAMUSCULAR | Status: AC
  Filled 2022-01-14: qty 10

## 2022-01-14 MED ORDER — ONDANSETRON HCL 4 MG/2ML IJ SOLN
INTRAMUSCULAR | Status: DC | PRN
  Administered 2022-01-14: 4 mg via INTRAVENOUS

## 2022-01-14 MED ORDER — PHENYLEPHRINE HCL-NACL 20-0.9 MG/250ML-% IV SOLN
INTRAVENOUS | Status: DC | PRN
  Administered 2022-01-14: 30 ug/min via INTRAVENOUS

## 2022-01-14 MED ORDER — LIDOCAINE HCL (PF) 2 % IJ SOLN
INTRAMUSCULAR | Status: AC
  Filled 2022-01-14: qty 5

## 2022-01-14 MED ORDER — FENTANYL CITRATE (PF) 100 MCG/2ML IJ SOLN
INTRAMUSCULAR | Status: AC
  Filled 2022-01-14: qty 2

## 2022-01-14 MED ORDER — DIPHENHYDRAMINE HCL 25 MG PO CAPS: 25.0000 mg | ORAL_CAPSULE | Freq: Every evening | ORAL | Status: DC | PRN

## 2022-01-14 MED ORDER — HYDROMORPHONE HCL 1 MG/ML IJ SOLN: 1.0000 mg | Freq: Once | INTRAMUSCULAR | Status: DC | PRN

## 2022-01-14 MED ORDER — SUGAMMADEX SODIUM 200 MG/2ML IV SOLN
INTRAVENOUS | Status: DC | PRN
  Administered 2022-01-14: 150 mg via INTRAVENOUS

## 2022-01-14 MED ORDER — DOPAMINE-DEXTROSE 3.2-5 MG/ML-% IV SOLN: 3.0000 ug/kg/min | INTRAVENOUS | Status: DC

## 2022-01-14 MED ORDER — SODIUM CHLORIDE 0.9 % IV SOLN: INTRAVENOUS | Status: DC | PRN

## 2022-01-14 MED ORDER — PROPOFOL 10 MG/ML IV BOLUS
INTRAVENOUS | Status: DC | PRN
  Administered 2022-01-14: 100 mg via INTRAVENOUS

## 2022-01-14 MED ORDER — ACETAMINOPHEN 650 MG RE SUPP: 325.0000 mg | RECTAL | Status: DC | PRN

## 2022-01-14 MED ORDER — CHLORHEXIDINE GLUCONATE CLOTH 2 % EX PADS
6.0000 | MEDICATED_PAD | Freq: Once | CUTANEOUS | Status: AC
  Administered 2022-01-14: 6 via TOPICAL

## 2022-01-14 MED ORDER — OXYCODONE HCL 5 MG/5ML PO SOLN: 5.0000 mg | Freq: Once | ORAL | Status: DC | PRN

## 2022-01-14 MED ORDER — INSULIN DETEMIR 100 UNIT/ML ~~LOC~~ SOLN
26.0000 [IU] | Freq: Every day | SUBCUTANEOUS | Status: DC
  Administered 2022-01-14: 26 [IU] via SUBCUTANEOUS
  Filled 2022-01-14 (×2): qty 0.26

## 2022-01-14 MED ORDER — EPHEDRINE SULFATE (PRESSORS) 50 MG/ML IJ SOLN
INTRAMUSCULAR | Status: DC | PRN
  Administered 2022-01-14: 10 mg via INTRAVENOUS
  Administered 2022-01-14: 5 mg via INTRAVENOUS

## 2022-01-14 MED ORDER — LABETALOL HCL 5 MG/ML IV SOLN: 10.0000 mg | INTRAVENOUS | Status: DC | PRN

## 2022-01-14 MED ORDER — ACETAMINOPHEN 10 MG/ML IV SOLN: 1000.0000 mg | Freq: Once | INTRAVENOUS | Status: DC | PRN

## 2022-01-14 MED ORDER — ORAL CARE MOUTH RINSE: 15.0000 mL | Freq: Once | OROMUCOSAL | Status: DC

## 2022-01-14 MED ORDER — PROPOFOL 10 MG/ML IV BOLUS
INTRAVENOUS | Status: AC
  Filled 2022-01-14: qty 20

## 2022-01-14 MED ORDER — ONDANSETRON HCL 4 MG/2ML IJ SOLN: 4.0000 mg | Freq: Four times a day (QID) | INTRAMUSCULAR | Status: DC | PRN

## 2022-01-14 MED ORDER — ADULT MULTIVITAMIN W/MINERALS CH
1.0000 | ORAL_TABLET | Freq: Every day | ORAL | Status: DC
  Administered 2022-01-14 – 2022-01-15 (×2): 1 via ORAL
  Filled 2022-01-14 (×2): qty 1

## 2022-01-14 MED ORDER — METOPROLOL TARTRATE 5 MG/5ML IV SOLN: 2.0000 mg | INTRAVENOUS | Status: DC | PRN

## 2022-01-14 MED ORDER — CEFAZOLIN SODIUM-DEXTROSE 2-4 GM/100ML-% IV SOLN
INTRAVENOUS | Status: AC
  Filled 2022-01-14: qty 100

## 2022-01-14 MED ORDER — ASPIRIN 81 MG PO TBEC: 81.0000 mg | DELAYED_RELEASE_TABLET | Freq: Every day | ORAL | Status: DC

## 2022-01-14 MED ORDER — LISINOPRIL 20 MG PO TABS
40.0000 mg | ORAL_TABLET | Freq: Every day | ORAL | Status: DC
  Administered 2022-01-15: 40 mg via ORAL
  Filled 2022-01-14: qty 2

## 2022-01-14 MED ORDER — DOCUSATE SODIUM 100 MG PO CAPS
100.0000 mg | ORAL_CAPSULE | Freq: Every day | ORAL | Status: DC
  Filled 2022-01-14: qty 1

## 2022-01-14 MED ORDER — FAMOTIDINE IN NACL 20-0.9 MG/50ML-% IV SOLN
20.0000 mg | Freq: Two times a day (BID) | INTRAVENOUS | Status: DC
Start: 2022-01-14 — End: 2022-01-15
  Administered 2022-01-14 (×2): 20 mg via INTRAVENOUS
  Filled 2022-01-14 (×2): qty 50

## 2022-01-14 MED ORDER — AMLODIPINE BESYLATE 5 MG PO TABS
5.0000 mg | ORAL_TABLET | Freq: Every day | ORAL | Status: DC
  Administered 2022-01-15: 5 mg via ORAL
  Filled 2022-01-14: qty 1

## 2022-01-14 MED ORDER — NITROGLYCERIN IN D5W 200-5 MCG/ML-% IV SOLN: 5.0000 ug/min | INTRAVENOUS | Status: DC

## 2022-01-14 MED ORDER — ATORVASTATIN CALCIUM 20 MG PO TABS: 40.0000 mg | ORAL_TABLET | Freq: Every evening | ORAL | Status: DC

## 2022-01-14 MED ORDER — GUAIFENESIN-DM 100-10 MG/5ML PO SYRP: 15.0000 mL | ORAL_SOLUTION | ORAL | Status: DC | PRN

## 2022-01-14 MED ORDER — CEFAZOLIN SODIUM-DEXTROSE 2-4 GM/100ML-% IV SOLN
2.0000 g | Freq: Three times a day (TID) | INTRAVENOUS | Status: AC
  Administered 2022-01-14 (×2): 2 g via INTRAVENOUS
  Filled 2022-01-14 (×2): qty 100

## 2022-01-14 MED ORDER — MIDAZOLAM HCL 2 MG/2ML IJ SOLN
INTRAMUSCULAR | Status: AC
  Filled 2022-01-14: qty 2

## 2022-01-14 MED ORDER — CINNAMON 500 MG PO CAPS: 2000.0000 mg | ORAL_CAPSULE | Freq: Every day | ORAL | Status: DC

## 2022-01-14 MED ORDER — CLOPIDOGREL BISULFATE 75 MG PO TABS
75.0000 mg | ORAL_TABLET | Freq: Every day | ORAL | Status: DC
  Administered 2022-01-15: 75 mg via ORAL
  Filled 2022-01-14: qty 1

## 2022-01-14 MED ORDER — OXYCODONE-ACETAMINOPHEN 5-325 MG PO TABS: 1.0000 | ORAL_TABLET | ORAL | Status: DC | PRN

## 2022-01-14 MED ORDER — ALUM & MAG HYDROXIDE-SIMETH 200-200-20 MG/5ML PO SUSP: 15.0000 mL | ORAL | Status: DC | PRN

## 2022-01-14 MED ORDER — POTASSIUM CHLORIDE CRYS ER 20 MEQ PO TBCR: 20.0000 meq | EXTENDED_RELEASE_TABLET | Freq: Every day | ORAL | Status: DC | PRN

## 2022-01-14 MED ORDER — DEXAMETHASONE SODIUM PHOSPHATE 10 MG/ML IJ SOLN
INTRAMUSCULAR | Status: DC | PRN
  Administered 2022-01-14: 5 mg via INTRAVENOUS

## 2022-01-14 MED ORDER — SODIUM CHLORIDE 0.9 % IV SOLN
INTRAVENOUS | Status: DC
  Administered 2022-01-14: 1000 mL via INTRAVENOUS

## 2022-01-14 MED ORDER — FENTANYL CITRATE (PF) 100 MCG/2ML IJ SOLN: 25.0000 ug | INTRAMUSCULAR | Status: DC | PRN

## 2022-01-14 MED ORDER — OXYCODONE HCL 5 MG PO TABS: 5.0000 mg | ORAL_TABLET | Freq: Once | ORAL | Status: DC | PRN

## 2022-01-14 MED ORDER — OMEGA-3-ACID ETHYL ESTERS 1 G PO CAPS
2.0000 g | ORAL_CAPSULE | Freq: Every day | ORAL | Status: DC
  Administered 2022-01-14: 2 g via ORAL
  Filled 2022-01-14 (×2): qty 2

## 2022-01-14 MED ORDER — GLIMEPIRIDE 4 MG PO TABS
4.0000 mg | ORAL_TABLET | Freq: Every day | ORAL | Status: DC
  Administered 2022-01-15: 4 mg via ORAL
  Filled 2022-01-14: qty 1

## 2022-01-14 MED ORDER — ONDANSETRON HCL 4 MG/2ML IJ SOLN
4.0000 mg | Freq: Once | INTRAMUSCULAR | Status: AC | PRN
  Administered 2022-01-14: 4 mg via INTRAVENOUS

## 2022-01-14 MED ORDER — ROCURONIUM BROMIDE 10 MG/ML (PF) SYRINGE
PREFILLED_SYRINGE | INTRAVENOUS | Status: AC
  Filled 2022-01-14: qty 10

## 2022-01-14 MED ORDER — CEFAZOLIN SODIUM-DEXTROSE 2-4 GM/100ML-% IV SOLN
2.0000 g | INTRAVENOUS | Status: AC
  Administered 2022-01-14: 2 g via INTRAVENOUS

## 2022-01-14 MED ORDER — MORPHINE SULFATE (PF) 2 MG/ML IV SOLN: 2.0000 mg | INTRAVENOUS | Status: DC | PRN

## 2022-01-14 MED ORDER — SODIUM CHLORIDE 0.9 % IV SOLN: INTRAVENOUS | Status: DC

## 2022-01-14 MED ORDER — MAGNESIUM SULFATE 2 GM/50ML IV SOLN: 2.0000 g | Freq: Every day | INTRAVENOUS | Status: DC | PRN

## 2022-01-14 MED ORDER — PROPOFOL 10 MG/ML IV BOLUS: INTRAVENOUS | Status: DC | PRN

## 2022-01-14 MED ORDER — PHENOL 1.4 % MT LIQD
1.0000 | OROMUCOSAL | Status: DC | PRN
Start: 2022-01-14 — End: 2022-01-15

## 2022-01-14 SURGICAL SUPPLY — 55 items
BALLN DORADO 8X60X80 (BALLOONS) ×2
BALLOON DORADO 8X60X80 (BALLOONS) IMPLANT
BLADE SURG 15 STRL LF DISP TIS (BLADE) IMPLANT
BLADE SURG 15 STRL SS (BLADE) ×2
BRUSH SCRUB EZ  4% CHG (MISCELLANEOUS) ×2
BRUSH SCRUB EZ 4% CHG (MISCELLANEOUS) IMPLANT
CATH ACCU-VU SIZ PIG 5F 70CM (CATHETERS) ×1 IMPLANT
CATH BALLN CODA 9X100X32 (BALLOONS) ×1 IMPLANT
CATH BEACON 5 .035 65 KMP TIP (CATHETERS) ×1 IMPLANT
CATH MICROCATH PRGRT 2.8F 110 (CATHETERS) IMPLANT
CATH TEMPO 5F RIM 65CM (CATHETERS) ×1 IMPLANT
CLOSURE PERCLOSE PROSTYLE (VASCULAR PRODUCTS) ×8 IMPLANT
COIL 400 COMPLEX SOFT 8X35CM (Vascular Products) IMPLANT
COIL 400 COMPLEX SOFT 8X60CM (Vascular Products) ×1 IMPLANT
COVER DRAPE FLUORO 36X44 (DRAPES) ×2 IMPLANT
COVER PROBE U/S 5X48 (MISCELLANEOUS) ×1 IMPLANT
DERMABOND ADVANCED (GAUZE/BANDAGES/DRESSINGS) ×1
DERMABOND ADVANCED .7 DNX12 (GAUZE/BANDAGES/DRESSINGS) IMPLANT
DEVICE OCCLUSION PODJ30 (Vascular Products) IMPLANT
DEVICE SAFEGUARD 24CM (GAUZE/BANDAGES/DRESSINGS) ×3 IMPLANT
DEVICE TORQUE (MISCELLANEOUS) ×1 IMPLANT
DRYSEAL FLEXSHEATH 12FR 33CM (SHEATH) ×1
DRYSEAL FLEXSHEATH 16FR 33CM (SHEATH) ×1
ELECT REM PT RETURN 9FT ADLT (ELECTROSURGICAL) ×2
ELECTRODE REM PT RTRN 9FT ADLT (ELECTROSURGICAL) IMPLANT
EXCLUDER TNK LEG 23MX14X18 (Endovascular Graft) IMPLANT
EXCLUDER TRUNK LEG 23MX14X18 (Endovascular Graft) ×2 IMPLANT
GLIDEWIRE STIFF .35X180X3 HYDR (WIRE) ×1 IMPLANT
GLOVE BIO SURGEON STRL SZ7 (GLOVE) ×4 IMPLANT
GLOVE SURG SYN 8.0 (GLOVE) ×2 IMPLANT
GLOVE SURG SYN 8.0 PF PI (GLOVE) IMPLANT
GOWN STRL REUS W/ TWL LRG LVL3 (GOWN DISPOSABLE) IMPLANT
GOWN STRL REUS W/ TWL XL LVL3 (GOWN DISPOSABLE) IMPLANT
GOWN STRL REUS W/TWL LRG LVL3 (GOWN DISPOSABLE) ×6
GOWN STRL REUS W/TWL XL LVL3 (GOWN DISPOSABLE) ×4
HANDLE DETACHMENT COIL (MISCELLANEOUS) ×1 IMPLANT
KIT ENCORE 26 ADVANTAGE (KITS) ×1 IMPLANT
LEG CONTRALATERAL 16X14.5X12 (Vascular Products) ×1 IMPLANT
MICROCATH PROGREAT 2.8F 110 CM (CATHETERS) ×2
OCCLUSION DEVICE PODJ30 (Vascular Products) ×2 IMPLANT
PACK ANGIOGRAPHY (CUSTOM PROCEDURE TRAY) ×2 IMPLANT
PACK BASIN MAJOR ARMC (MISCELLANEOUS) ×1 IMPLANT
SET INTRO CAPELLA COAXIAL (SET/KITS/TRAYS/PACK) ×1 IMPLANT
SHEATH BRITE TIP 6FRX11 (SHEATH) ×2 IMPLANT
SHEATH BRITE TIP 8FRX11 (SHEATH) ×2 IMPLANT
SHEATH DRYSEAL FLEX 12FR 33CM (SHEATH) IMPLANT
SHEATH DRYSEAL FLEX 16FR 33CM (SHEATH) IMPLANT
SPONGE XRAY 4X4 16PLY STRL (MISCELLANEOUS) ×3 IMPLANT
SUT MNCRL 4-0 (SUTURE) ×2
SUT MNCRL 4-0 27XMFL (SUTURE) ×1
SUTURE MNCRL 4-0 27XMF (SUTURE) IMPLANT
SYR MEDRAD MARK 7 150ML (SYRINGE) ×1 IMPLANT
TUBING CONTRAST HIGH PRESS 72 (TUBING) ×1 IMPLANT
WIRE AMPLATZ SSTIFF .035X260CM (WIRE) ×2 IMPLANT
WIRE GUIDERIGHT .035X150 (WIRE) ×2 IMPLANT

## 2022-01-14 NOTE — Anesthesia Postprocedure Evaluation (Signed)
Anesthesia Post Note  Patient: Kristin Hughes  Procedure(s) Performed: ENDOVASCULAR REPAIR/STENT GRAFT  Patient location during evaluation: Endoscopy Anesthesia Type: General Level of consciousness: awake and alert Pain management: pain level controlled Vital Signs Assessment: post-procedure vital signs reviewed and stable Respiratory status: spontaneous breathing, nonlabored ventilation, respiratory function stable and patient connected to nasal cannula oxygen Cardiovascular status: blood pressure returned to baseline and stable Postop Assessment: no apparent nausea or vomiting Anesthetic complications: no   No notable events documented.   Last Vitals:  Vitals:   01/14/22 1130 01/14/22 1145  BP: 116/65 108/62  Pulse: 69 70  Resp: 18 16  Temp:    SpO2: 99% 100%    Last Pain:  Vitals:   01/14/22 1125  TempSrc: Oral  PainSc: 0-No pain                 Arita Miss

## 2022-01-14 NOTE — Op Note (Signed)
OPERATIVE NOTE   PROCEDURE: US guidance for vascular access, bilateral femoral arteries Catheter placement into aorta from bilateral femoral approaches Selective catheter placement into right internal iliac artery with selective imaging of the right internal iliac artery Coil embolization of the right internal iliac artery with a pair of 8 mm Ruby Coils Placement of a 23 mm x 18 cm length Gore Excluder Endoprosthesis main body right with a 14 mm diameter x 12 cm length left iliac contralateral limb ProGlide closure devices bilateral femoral arteries  PRE-OPERATIVE DIAGNOSIS: AAA  POST-OPERATIVE DIAGNOSIS: same  SURGEON: Leotis Pain, MD and Hortencia Pilar, MD - Co-surgeons  ANESTHESIA: general  ESTIMATED BLOOD LOSS: 50 cc  FINDING(S): 1.  AAA  SPECIMEN(S):  none  INDICATIONS:   Kristin Hughes is a 71 y.o. female who presents with a >5 cm AAA with a very short and diseased right common iliac artery with less than 2 cm of length for seal requiring coil embolization of the right internal iliac artery. The anatomy was suitable for endovascular repair.  Risks and benefits of repair in an endovascular fashion were discussed and informed consent was obtained. Co-surgeons are used to expedite the procedure and reduce operative time as bilateral work needs to be done.  DESCRIPTION: After obtaining full informed written consent, the patient was brought back to the operating room and placed supine upon the operating table.  The patient received IV antibiotics prior to induction.  After obtaining adequate anesthesia, the patient was prepped and draped in the standard fashion for endovascular AAA repair.  We then began by gaining access to both femoral arteries with US guidance with me working on the left and Dr. Delana Meyer working on the right.  The femoral arteries were found to be patent and accessed without difficulty with a needle under ultrasound guidance without difficulty on each side and  permanent images were recorded.  We then placed 2 proglide devices on each side in a pre-close fashion and placed 8 French sheaths. The patient was then given 5000 units of intravenous heparin.  I then cannulated the right internal iliac artery with a Kumpe catheter and a Glidewire.  Selective imaging the right hypogastric artery was performed showing successful cannulation implanting the coil embolization.  A progreat microcatheter was placed through the Kumpe catheter and then an 8 mm diameter by 60 cm length soft Ruby coil in 30 cm packing coil were placed in the right hypogastric artery successfully occluding it on completion imaging.  The Pigtail catheter was placed into the aorta from the left side. Using this image, we selected a 23 mm diameter by 18 cm length Main body device.  Over a stiff wire, an 16 French sheath was placed was placed up the right side. The main body was then placed through the 16 French sheath on the right side. A Kumpe catheter was placed up the left side and a magnified image at the renal arteries was performed. The main body was then deployed just below the lowest renal artery which was the left renal artery. The Kumpe catheter was used to cannulate the contralateral gate and successful cannulation was confirmed by twirling the pigtail catheter in the main body. We then placed a stiff wire and a retrograde arteriogram was performed through the left femoral sheath. We upsized to the 12 [place] sheath on the left side for the contralateral limb and a 14 mm diameter by 12 cm length left iliac limb was selected and deployed coming down to just  above the left internal iliac artery. The main body deployment was then completed extending down about 3 to 4 cm into the right external iliac artery to gain a good seal. Based off the angiographic findings, further extension limbs were not necessary. All junction points and seals zones were treated with the compliant balloon. The pigtail catheter  was then replaced and a completion angiogram was performed.  No obvious endoleak was detected on completion angiography. The renal arteries were found to be widely patent.  The left hypogastric artery was widely patent.  The right hypogastric artery was successfully embolized. At this point we elected to terminate the procedure. We secured the pro glide devices for hemostasis on the femoral arteries. The skin incision was closed with a 4-0 Monocryl. Dermabond and pressure dressing were placed. The patient was taken to the recovery room in stable condition having tolerated the procedure well.  COMPLICATIONS: none  CONDITION: stable  Leotis Pain  01/14/2022, 10:13 AM   This note was created with Dragon Medical transcription system. Any errors in dictation are purely unintentional.

## 2022-01-14 NOTE — Anesthesia Preprocedure Evaluation (Signed)
Anesthesia Evaluation  Patient identified by MRN, date of birth, ID band Patient awake  General Assessment Comment:Has not had anesthesia in past 20-30 years  Reviewed: Allergy & Precautions, NPO status , Patient's Chart, lab work & pertinent test results  History of Anesthesia Complications Negative for: history of anesthetic complications  Airway Mallampati: II  TM Distance: >3 FB Neck ROM: Full    Dental  (+) Edentulous Upper, Edentulous Lower   Pulmonary neg sleep apnea, COPD,  COPD inhaler, Current Smoker and Patient abstained from smoking.,  Takes inhalers 4 times per day, took inhaler this morning. Breathing feels baseline. Never hospitalized for COPD. Last smoked two days ago    + decreased breath sounds      Cardiovascular Exercise Tolerance: Poor METS: 3 - Mets hypertension, Pt. on medications + CAD and + Peripheral Vascular Disease  (-) Past MI (-) dysrhythmias  Rhythm:Regular Rate:Normal - Systolic murmurs TTE 6144: 1. Left ventricular ejection fraction, by estimation, is 60 to 65%. The  left ventricle has normal function. The left ventricle has no regional  wall motion abnormalities. There is mild left ventricular hypertrophy.  Left ventricular diastolic parameters  were normal.  2. Right ventricular systolic function is normal. The right ventricular  size is normal. There is normal pulmonary artery systolic pressure.  3. The mitral valve is degenerative. Trivial mitral valve regurgitation.  4. The aortic valve was not well visualized. Aortic valve regurgitation  is not visualized. Aortic valve sclerosis/calcification is present,  without any evidence of aortic stenosis.  5. The inferior vena cava is normal in size with greater than 50%  respiratory variability, suggesting right atrial pressure of 3 mmHg.   Unremarkable stress echo. Per cardiology, patient is at an "acceptable" risk for procedure    Neuro/Psych negative neurological ROS  negative psych ROS   GI/Hepatic GERD  Controlled,(+)     (-) substance abuse  ,   Endo/Other  diabetes, Type 2, Insulin Dependent  Renal/GU negative Renal ROS     Musculoskeletal   Abdominal   Peds  Hematology   Anesthesia Other Findings Past Medical History: 11/09/2021: AAA (abdominal aortic aneurysm) ([place])     Comment:  a.) CTA abd/pelvis 11/09/2021: 5.3 x 5.2 cm fusiform AAA No date: Aortic atherosclerosis (HCC) No date: COPD (chronic obstructive pulmonary disease) (HCC) No date: Coronary artery disease     Comment:  a.) Lexi 12/20/2021: EF >75%, LAD and LCx               calcifications; no ischemia. No date: Diverticulosis No date: GERD (gastroesophageal reflux disease) No date: Hypertension 11/29/2020: Left upper lobe pulmonary nodule     Comment:  a.) LDCT 11/29/2020: 4.1 mm subpleural LUL nodule. No date: Mixed hyperlipidemia No date: RBBB No date: Smokers' cough ([place]) No date: Tobacco use No date: Type 2 diabetes mellitus treated with insulin (HCC)  Reproductive/Obstetrics                             Anesthesia Physical Anesthesia Plan  ASA: 3  Anesthesia Plan: General   Post-op Pain Management: Ofirmev IV (intra-op)*   Induction: Intravenous  PONV Risk Score and Plan: 2 and Ondansetron, Dexamethasone and Treatment may vary due to age or medical condition  Airway Management Planned: Oral ETT and Video Laryngoscope Planned  Additional Equipment: Arterial line  Intra-op Plan:   Post-operative Plan: Extubation in OR and Possible Post-op intubation/ventilation  Informed Consent: I have reviewed the  patients History and Physical, chart, labs and discussed the procedure including the risks, benefits and alternatives for the proposed anesthesia with the patient or authorized representative who has indicated his/her understanding and acceptance.     Dental advisory given  Plan  Discussed with: CRNA and Surgeon  Anesthesia Plan Comments: (Discussed risks of anesthesia with patient, including PONV, sore throat, lip/dental/eye damage. Rare risks discussed as well, such as cardiorespiratory and neurological sequelae, blood loss requiring transfusion, and allergic reactions. Discussed the role of CRNA in patient's perioperative care. Patient understands. Patient counseled on benefits of smoking cessation, and increased perioperative risks associated with continued smoking. Discussed possibility of arterial line, and possibility of prolongted intubation. )        Anesthesia Quick Evaluation

## 2022-01-14 NOTE — Op Note (Signed)
OPERATIVE NOTE   PROCEDURE: US guidance for vascular access, bilateral femoral arteries Catheter placement into aorta from left femoral approach Catheter placement into the third order branches of the right internal iliac artery right femoral approach. Coil embolization of the right internal iliac artery using 2 Ruby coils Placement of a 23 x 14 x 18 C3 Gore Excluder Endoprosthesis main body with a 14 x 12 contralateral limb ProGlide closure devices bilateral femoral arteries  PRE-OPERATIVE DIAGNOSIS: AAA  POST-OPERATIVE DIAGNOSIS: same  SURGEON: Hortencia Pilar, MD and Leotis Pain, MD - Co-surgeons  ANESTHESIA: general  ESTIMATED BLOOD LOSS: 100 cc  FINDING(S): 1.  AAA  SPECIMEN(S):  none  INDICATIONS:   Kristin Hughes is a 71 y.o. y.o. female who presents with a large abdominal aortic aneurysm.  She has very short common iliac arteries and coil embolization of the right internal iliac artery will be required.  The risks and benefits were reviewed all questions answered patient agrees to proceed  DESCRIPTION: After obtaining full informed written consent, the patient was brought back to the operating room and placed supine upon the operating table.  The patient received IV antibiotics prior to induction.  After obtaining adequate anesthesia, the patient was prepped and draped in the standard fashion for endovascular AAA repair.  Co-surgeons are required because this is a complex bilateral procedure with work being performed simultaneously from both the right femoral and left femoral approach.  This also expedites the procedure making a shorter operative time reducing complications and improving patient safety.  We then began by gaining access to both femoral arteries with US guidance with me working on the patient's right and Dr. Lucky Cowboy working on the patient's left.  The femoral arteries were found to be patent and accessed without difficulty with a needle under ultrasound  guidance without difficulty on each side and permanent images were recorded.  We then placed 2 proglide devices on each side in a pre-close fashion and placed 8 French sheaths.  Prior to placing the 8 French sheath Dr. Lucky Cowboy was able to cannulate the right hypogastric with a Kumpe catheter.  Hand-injection contrast confirmed positioning and intraluminal location.  Next a prograde catheter was advanced distal to the Kumpe catheter and an 8 mm x 60 cm soft Ruby coil was deployed followed by a 30 cm packing coil.  Follow-up imaging demonstrated occlusion of the internal iliac.  Using a stiff angled Glidewire and the Kumpe catheter the wire catheter were then negotiated into the aorta and through the aneurysm.  The patient was then given 6000 units of intravenous heparin.   The Pigtail catheter was placed into the aorta from the left side. Using this image, we selected a 23 x 14 x 18 Main body device.  Over a stiff wire, an 16 French sheath was placed.  However, prior to advancing the sheath an 8 mm x 60 mm [place] balloon was used to angioplasty the right common iliac artery secondary to a greater than 80% stenosis.  The sheath was then advanced under fluoroscopic Guidance without difficulty.  The main body was then placed through the 16 French sheath. A Kumpe catheter was placed up the left side and a magnified image at the renal arteries was performed. The main body was then deployed just below the lowest renal artery. The Kumpe catheter was used to cannulate the contralateral gate without difficulty and successful cannulation was confirmed by twirling the pigtail catheter in the main body. We then placed a stiff wire  and a retrograde arteriogram was performed through the left femoral sheath. We upsized to the 12 [place] sheath for the contralateral limb and a 14 x 12 limb was selected and deployed. The main body deployment was then completed. Based off the angiographic findings, extension limbs were not necessary.   All junction points and seals zones were treated with the compliant balloon.   The pigtail catheter was then replaced and a completion angiogram was performed.   No endoleak was detected on completion angiography. The renal arteries were found to be widely patent.  Excellent seal was noted across the right hypogastric.   At this point we elected to terminate the procedure. We secured the pro glide devices for hemostasis on the femoral arteries. The skin incision was closed with a 4-0 Monocryl. Dermabond and pressure dressing were placed. The patient was taken to the recovery room in stable condition having tolerated the procedure well.  COMPLICATIONS: none  CONDITION: stable  Hortencia Pilar  01/14/2022, 10:06 AM

## 2022-01-14 NOTE — Progress Notes (Signed)
PHARMACIST - PHYSICIAN ORDER COMMUNICATION  CONCERNING: P&T Medication Policy on Herbal Medications  DESCRIPTION:  This patient's order for: Cinnamon 2000 mg at bedtime has been noted.  This product(s) is classified as an "herbal" or natural product. Due to a lack of definitive safety studies or FDA approval, nonstandard manufacturing practices, plus the potential risk of unknown drug-drug interactions while on inpatient medications, the Pharmacy and Therapeutics Committee does not permit the use of "herbal" or natural products of this type within [facility].   ACTION TAKEN: The pharmacy department is unable to verify this order at this time and your patient has been informed of this safety policy. Please reevaluate patient's clinical condition at discharge and address if the herbal or natural product(s) should be resumed at that time.   Gretel Acre, PharmD PGY1 Pharmacy Resident 01/14/2022 12:17 PM

## 2022-01-14 NOTE — Anesthesia Procedure Notes (Signed)
Procedure Name: Intubation Date/Time: 01/14/2022 8:28 AM  Performed by: Jerrye Noble, CRNAPre-anesthesia Checklist: Patient identified, Emergency Drugs available, Suction available and Patient being monitored Patient Re-evaluated:Patient Re-evaluated prior to induction Oxygen Delivery Method: Circle system utilized Preoxygenation: Pre-oxygenation with 100% oxygen Induction Type: IV induction Ventilation: Mask ventilation without difficulty Laryngoscope Size: McGraph and 3 Grade View: Grade I Tube type: Oral Tube size: 6.5 mm Number of attempts: 1 Airway Equipment and Method: Stylet, Video-laryngoscopy and Oral airway Placement Confirmation: ETT inserted through vocal cords under direct vision, positive ETCO2 and breath sounds checked- equal and bilateral Secured at: 22 cm Tube secured with: Tape Dental Injury: Teeth and Oropharynx as per pre-operative assessment

## 2022-01-14 NOTE — TOC Initial Note (Signed)
Transition of Care [facility]) - Initial/Assessment Note    Patient Details  Name: Kristin Hughes MRN: [id number] Date of Birth: Jun 10, 1951  Transition of Care [facility]) CM/SW Contact:    Conception Oms, RN Phone Number: 01/14/2022, 9:23 AM  Clinical Narrative:                   Transition of Care [facility]) Screening Note   Patient Details  Name: Kristin Hughes Date of Birth: 1950/09/07   [facility]) CM/SW Contact:    Conception Oms, RN Phone Number: 01/14/2022, 9:24 AM    Transition of Care Department [facility]) has reviewed patient and no TOC needs have been identified at this time. We will continue to monitor patient advancement through interdisciplinary progression rounds. If new patient transition needs arise, please place a TOC consult.         Patient Goals and CMS Choice        Expected Discharge Plan and Services                                                Prior Living Arrangements/Services                       Activities of Daily Living Home Assistive Devices/Equipment: None ADL Screening (condition at time of admission) Patient's cognitive ability adequate to safely complete daily activities?: Yes Is the patient deaf or have difficulty hearing?: No Does the patient have difficulty seeing, even when wearing glasses/contacts?: No Does the patient have difficulty concentrating, remembering, or making decisions?: No Patient able to express need for assistance with ADLs?: Yes Does the patient have difficulty dressing or bathing?: No Independently performs ADLs?: Yes (appropriate for developmental age) Communication: Independent Does the patient have difficulty walking or climbing stairs?: No Weakness of Legs: None Weakness of Arms/Hands: None  Permission Sought/Granted                  Emotional Assessment              Admission diagnosis:  Endovascular AAA Repair  GORE   AAA Dr Delana Meyer to assist Patient  Active Problem List   Diagnosis Date Noted   Acid reflux 11/16/2021   Benign essential hypertension 11/16/2021   Body mass index (BMI) 30.0-30.9, adult 11/16/2021   Chronic obstructive pulmonary disease, unspecified ([place]) 11/16/2021   Encounter for other screening for malignant neoplasm of breast 11/16/2021   Family history of malignant neoplasm of digestive organs 11/16/2021   Long term (current) use of insulin ([place]) 11/16/2021   Mixed hyperlipidemia 11/16/2021   Tobacco user 11/16/2021   Type 2 diabetes mellitus with hyperglycemia ([place]) 11/16/2021   Smokers' cough ([place]) 11/16/2021   Type 2 diabetes mellitus without complications ([place]) 75/17/0017   AAA (abdominal aortic aneurysm) without rupture ([place]) 11/16/2021   PCP:  Joyice Faster, FNP Pharmacy:   [place], [place] - [address] [place] [address] Phone: [phone] Fax: [phone]  [facility] ([facility] ) - [place] [address] Phone: [phone] Fax: [phone]     Social Determinants of Health (SDOH) Interventions    Readmission Risk Interventions     No data to display

## 2022-01-14 NOTE — Progress Notes (Signed)
Pt arrived to rm ICU 2 at approx 1123, pt A&O X4, Bilateral groin puncture site C/D/I, safeguard on bilateral groin 65ms of air. Bilateral pedal pulses +1.

## 2022-01-14 NOTE — Transfer of Care (Signed)
Immediate Anesthesia Transfer of Care Note  Patient: Kristin Hughes  Procedure(s) Performed: ENDOVASCULAR REPAIR/STENT GRAFT  Patient Location: PACU  Anesthesia Type:General  Level of Consciousness: awake, drowsy and patient cooperative  Airway & Oxygen Therapy: Patient Spontanous Breathing and Patient connected to face mask oxygen  Post-op Assessment: Report given to RN and Post -op Vital signs reviewed and stable  Post vital signs: Reviewed and stable  Last Vitals:  Vitals Value Taken Time  BP 139/75 01/14/22 1025  Temp    Pulse 81 01/14/22 1030  Resp 15 01/14/22 1030  SpO2 100 % 01/14/22 1030  Vitals shown include unvalidated device data.  Last Pain:  Vitals:   01/14/22 0714  TempSrc: Oral  PainSc: 0-No pain         Complications: No notable events documented.

## 2022-01-14 NOTE — H&P (Signed)
[facility] Admission History & Physical  MRN : [id number]  Kristin Hughes is a 71 y.o. (18-Aug-1950) female who presents with chief complaint of No chief complaint on file. Marland Kitchen  History of Present Illness: patient presents for repair of her >5 cm AAA. No new complaints.  Current Facility-Administered Medications  Medication Dose Route Frequency Provider Last Rate Last Admin   0.9 %  sodium chloride infusion   Intravenous Continuous Darrin Nipper, MD 50 mL/hr at 01/14/22 0725 1,000 mL at 01/14/22 0725   ceFAZolin (ANCEF) 2-4 GM/100ML-% IVPB            ceFAZolin (ANCEF) IVPB 2g/100 mL premix  2 g Intravenous On Call to OR Kris Hartmann, NP       Chlorhexidine Gluconate Cloth 2 % PADS 6 each  6 each Topical Once Kris Hartmann, NP       HYDROmorphone (DILAUDID) injection 1 mg  1 mg Intravenous Once PRN Kris Hartmann, NP       ondansetron [facility]) injection 4 mg  4 mg Intravenous Q6H PRN Kris Hartmann, NP        Past Medical History:  Diagnosis Date   AAA (abdominal aortic aneurysm) ([place]) 11/09/2021   a.) CTA abd/pelvis 11/09/2021: 5.3 x 5.2 cm fusiform AAA   Aortic atherosclerosis (HCC)    COPD (chronic obstructive pulmonary disease) ([place])    Coronary artery disease    a.) Lexi 12/20/2021: EF >75%, LAD and LCx calcifications; no ischemia.   Diverticulosis    GERD (gastroesophageal reflux disease)    Hypertension    Left upper lobe pulmonary nodule 11/29/2020   a.) LDCT 11/29/2020: 4.1 mm subpleural LUL nodule.   Mixed hyperlipidemia    RBBB    Smokers' cough ([place])    Tobacco use    Type 2 diabetes mellitus treated with insulin [facility])     Past Surgical History:  Procedure Laterality Date   APPENDECTOMY  1970   COLONOSCOPY N/A 02/19/2017   Procedure: COLONOSCOPY;  Surgeon: Daneil Dolin, MD;  Location: [facility];  Service: Endoscopy;  Laterality: N/A;  8:15 am   TUBAL LIGATION       Social History   Tobacco Use   Smoking status:  Every Day    Packs/day: 1.00    Years: 45.00    Total pack years: 45.00    Types: Cigarettes   Smokeless tobacco: Never  Vaping Use   Vaping Use: Never used  Substance Use Topics   Alcohol use: No   Drug use: No     Family History  Problem Relation Age of Onset   Colon cancer Father     Allergies  Allergen Reactions   Other     ORANGES/ CAUSES SWELLING IN EYES AND LIPS   Orange Oil     Other reaction(s): eyes, lips swells     REVIEW OF SYSTEMS (Negative unless checked)  Constitutional: '[]'$ Weight loss  '[]'$ Fever  '[]'$ Chills Cardiac: '[]'$ Chest pain   '[]'$ Chest pressure   '[]'$ Palpitations   '[]'$ Shortness of breath when laying flat   '[]'$ Shortness of breath at rest   '[x]'$ Shortness of breath with exertion. Vascular:  '[]'$ Pain in legs with walking   '[]'$ Pain in legs at rest   '[]'$ Pain in legs when laying flat   '[]'$ Claudication   '[]'$ Pain in feet when walking  '[]'$ Pain in feet at rest  '[]'$ Pain in feet when laying flat   '[]'$ History of DVT   '[]'$ Phlebitis   '[]'$ Swelling in legs   '[]'$   Varicose veins   '[]'$ Non-healing ulcers Pulmonary:   '[]'$ Uses home oxygen   '[]'$ Productive cough   '[]'$ Hemoptysis   '[]'$ Wheeze  '[x]'$ COPD   '[]'$ Asthma Neurologic:  '[]'$ Dizziness  '[]'$ Blackouts   '[]'$ Seizures   '[]'$ History of stroke   '[]'$ History of TIA  '[]'$ Aphasia   '[]'$ Temporary blindness   '[]'$ Dysphagia   '[]'$ Weakness or numbness in arms   '[]'$ Weakness or numbness in legs Musculoskeletal:  '[x]'$ Arthritis   '[]'$ Joint swelling   '[]'$ Joint pain   '[x]'$ Low back pain Hematologic:  '[]'$ Easy bruising  '[]'$ Easy bleeding   '[]'$ Hypercoagulable state   '[]'$ Anemic  '[]'$ Hepatitis Gastrointestinal:  '[]'$ Blood in stool   '[]'$ Vomiting blood  '[x]'$ Gastroesophageal reflux/heartburn   '[]'$ Difficulty swallowing. Genitourinary:  '[]'$ Chronic kidney disease   '[]'$ Difficult urination  '[]'$ Frequent urination  '[]'$ Burning with urination   '[]'$ Blood in urine Skin:  '[]'$ Rashes   '[]'$ Ulcers   '[]'$ Wounds Psychological:  '[]'$ History of anxiety   '[]'$  History of major depression.  Physical Examination  Vitals:   01/14/22 0714  BP: (!) 146/82   Pulse: 78  Resp: 14  Temp: 97.8 F (36.6 C)  TempSrc: Oral  SpO2: 99%  Weight: 70.3 kg  Height: '5\' 4"'$  (1.626 m)   Body mass index is 26.61 kg/m. Gen: WD/WN, NAD Head: [place]/AT, No temporalis wasting.  Ear/Nose/Throat: Hearing grossly intact, nares w/o erythema or drainage, oropharynx w/o Erythema/Exudate,  Eyes: Conjunctiva clear, sclera non-icteric Neck: Trachea midline.  No JVD.  Pulmonary:  Good air movement, respirations not labored, no use of accessory muscles.  Cardiac: RRR, normal S1, S2. Vascular:  Vessel Right Left  Radial Palpable Palpable                                   Gastrointestinal: soft, non-tender/non-distended. No guarding/reflex. Increased aortic impulse Musculoskeletal: M/S 5/5 throughout.  Extremities without ischemic changes.  No deformity or atrophy.  Neurologic: Sensation grossly intact in extremities.  Symmetrical.  Speech is fluent. Motor exam as listed above. Psychiatric: Judgment intact, Mood & affect appropriate for pt's clinical situation. Dermatologic: No rashes or ulcers noted.  No cellulitis or open wounds.      CBC Lab Results  Component Value Date   WBC 8.1 01/11/2022   HGB 14.2 01/11/2022   HCT 44.3 01/11/2022   MCV 93.9 01/11/2022   PLT 249 01/11/2022    BMET    Component Value Date/Time   NA 137 01/11/2022 1155   K 5.0 01/11/2022 1155   CL 101 01/11/2022 1155   CO2 29 01/11/2022 1155   GLUCOSE 265 (H) 01/11/2022 1155   BUN 10 01/11/2022 1155   CREATININE 1.08 (H) 01/11/2022 1155   CALCIUM 9.5 01/11/2022 1155   GFRNONAA 54 (L) 01/11/2022 1155   Estimated Creatinine Clearance: 44.6 mL/min (A) (by C-G formula based on SCr of 1.08 mg/dL (H)).  COAG No results found for: "INR", "PROTIME"  Radiology NM Myocar Multi W/Spect W/Wall Motion / EF  Result Date: 12/20/2021   . The study is low risk.   There is no evidence for ischemia   Left ventricular function is normal/hyperdynamic. LVEF > 75%   LAD and LCx coronary  calcifications noted.   ECHOCARDIOGRAM COMPLETE  Result Date: 12/19/2021    ECHOCARDIOGRAM REPORT   Patient Name:   ANNALYN BLECHER Date of Exam: 12/19/2021 Medical Rec #:  [id number]        Height:       64.0 in Accession #:    [id number]  Weight:       153.0 lb Date of Birth:  1950-09-06        BSA:          1.746 m Patient Age:    14 years         BP:           140/82 mmHg Patient Gender: F                HR:           89 bpm. Exam Location:  [place] Procedure: 2D Echo, Cardiac Doppler, Color Doppler and Strain Analysis Indications:    Coronary artery disease involving native coronary artery of                 native heart, unspecified whether angina present [I25.10                 (ICD-10-CM)  History:        Patient has no prior history of Echocardiogram examinations. AAA                 (abdominal aortic aneurysm) without rupture and COPD; Risk                 Factors:Diabetes, Current Smoker and Dyslipidemia.  Sonographer:    Luane School RDCS Referring Phys: [id number] BRIAN AGBOR-ETANG IMPRESSIONS  1. Left ventricular ejection fraction, by estimation, is 60 to 65%. The left ventricle has normal function. The left ventricle has no regional wall motion abnormalities. There is mild left ventricular hypertrophy. Left ventricular diastolic parameters were normal.  2. Right ventricular systolic function is normal. The right ventricular size is normal. There is normal pulmonary artery systolic pressure.  3. The mitral valve is degenerative. Trivial mitral valve regurgitation.  4. The aortic valve was not well visualized. Aortic valve regurgitation is not visualized. Aortic valve sclerosis/calcification is present, without any evidence of aortic stenosis.  5. The inferior vena cava is normal in size with greater than 50% respiratory variability, suggesting right atrial pressure of 3 mmHg. FINDINGS  Left Ventricle: Left ventricular ejection fraction, by estimation, is 60 to 65%. The left ventricle has normal  function. The left ventricle has no regional wall motion abnormalities. Global longitudinal strain performed but not reported based on interpreter judgement due to suboptimal tracking. The left ventricular internal cavity size was normal in size. There is mild left ventricular hypertrophy. Left ventricular diastolic parameters were normal. Right Ventricle: The right ventricular size is normal. No increase in right ventricular wall thickness. Right ventricular systolic function is normal. There is normal pulmonary artery systolic pressure. The tricuspid regurgitant velocity is 2.32 m/s, and  with an assumed right atrial pressure of 3 mmHg, the estimated right ventricular systolic pressure is 95.6 mmHg. Left Atrium: Left atrial size was normal in size. Right Atrium: Right atrial size was normal in size. Pericardium: There is no evidence of pericardial effusion. Mitral Valve: The mitral valve is degenerative in appearance. There is moderate thickening of the mitral valve leaflet(s). There is mild calcification of the mitral valve leaflet(s). Trivial mitral valve regurgitation. MV peak gradient, 9.1 mmHg. The mean mitral valve gradient is 3.0 mmHg. Tricuspid Valve: The tricuspid valve is normal in structure. Tricuspid valve regurgitation is trivial. Aortic Valve: The aortic valve was not well visualized. Aortic valve regurgitation is not visualized. Aortic valve sclerosis/calcification is present, without any evidence of aortic stenosis. Pulmonic Valve: The pulmonic valve was not well visualized. Pulmonic valve regurgitation is  not visualized. Aorta: The aortic root and ascending aorta are structurally normal, with no evidence of dilitation. Venous: The inferior vena cava is normal in size with greater than 50% respiratory variability, suggesting right atrial pressure of 3 mmHg. IAS/Shunts: No atrial level shunt detected by color flow Doppler.  LEFT VENTRICLE PLAX 2D LVIDd:         3.80 cm   Diastology LVIDs:          2.50 cm   LV e' medial:    4.24 cm/s LV PW:         1.10 cm   LV E/e' medial:  22.1 LV IVS:        1.10 cm   LV e' lateral:   6.53 cm/s LVOT diam:     2.00 cm   LV E/e' lateral: 14.3 LV SV:         75 LV SV Index:   43 LVOT Area:     3.14 cm  RIGHT VENTRICLE             IVC RV S prime:     10.90 cm/s  IVC diam: 1.30 cm TAPSE (M-mode): 2.3 cm LEFT ATRIUM             Index        RIGHT ATRIUM          Index LA diam:        3.10 cm 1.78 cm/m   RA Area:     9.87 cm LA Vol (A2C):   28.1 ml 16.10 ml/m  RA Volume:   21.70 ml 12.43 ml/m LA Vol (A4C):   31.9 ml 18.27 ml/m LA Biplane Vol: 29.5 ml 16.90 ml/m  AORTIC VALVE LVOT Vmax:   121.50 cm/s LVOT Vmean:  78.150 cm/s LVOT VTI:    0.238 m  AORTA Ao Root diam: 2.90 cm Ao Asc diam:  3.10 cm Ao Desc diam: 2.20 cm MITRAL VALVE                TRICUSPID VALVE MV Area (PHT): 3.21 cm     TR Peak grad:   21.5 mmHg MV Area VTI:   2.47 cm     TR Vmax:        232.00 cm/s MV Peak grad:  9.1 mmHg MV Mean grad:  3.0 mmHg     SHUNTS MV Vmax:       1.51 m/s     Systemic VTI:  0.24 m MV Vmean:      84.0 cm/s    Systemic Diam: 2.00 cm MV Decel Time: 236 msec MV E velocity: 93.50 cm/s MV A velocity: 139.00 cm/s MV E/A ratio:  0.67 Kate Sable MD Electronically signed by Kate Sable MD Signature Date/Time: 12/19/2021/1:30:51 PM    Final      Assessment/Plan AAA (abdominal aortic aneurysm) without rupture (HCC) The patient has a 5.3 cm infrarenal abdominal aortic aneurysm with 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   Recommend:  The aneurysm is > 5 cm and therefore should undergo repair. Patient is status post CT scan of the abdominal aorta. The patient is a candidate for endovascular repair.    The patient will require cardiac clearance prior to stent graft placement.    The patient will continue antiplatelet therapy as prescribed (since the patient is undergoing endovascular repair as opposed to open repair) as well  as aggressive management of hyperlipidemia. Exercise is again strongly encouraged.  The patient is reminded that lifetime routine surveillance is a necessity with an endograft.    The risks and benefits of AAA repair are reviewed with the patient.  All questions are answered.  Alternative therapies are also discussed.  The patient agrees to proceed with endovascular aneurysm repair.   Patient will follow-up with me in the office after the surgery.   Benign essential hypertension blood pressure control important in reducing the progression of atherosclerotic disease and aneurysmal growth. On appropriate oral medications.     Type 2 diabetes mellitus without complications (HCC) blood glucose control important in reducing the progression of atherosclerotic disease. Also, involved in wound healing. On appropriate medications.     Mixed hyperlipidemia lipid control important in reducing the progression of atherosclerotic disease. Continue statin therapy   Leotis Pain, MD  01/14/2022 7:57 AM

## 2022-01-15 LAB — BASIC METABOLIC PANEL
Anion gap: 7 (ref 5–15)
BUN: 10 mg/dL (ref 8–23)
CO2: 25 mmol/L (ref 22–32)
Calcium: 8.8 mg/dL — ABNORMAL LOW (ref 8.9–10.3)
Chloride: 109 mmol/L (ref 98–111)
Creatinine, Ser: 0.94 mg/dL (ref 0.44–1.00)
GFR, Estimated: >60 mL/min (ref >60)
Glucose, Bld: 164 mg/dL — ABNORMAL HIGH (ref 70–99)
Potassium: 4.2 mmol/L (ref 3.5–5.1)
Sodium: 141 mmol/L (ref 135–145)

## 2022-01-15 LAB — CBC
HCT: 37 % (ref 36.0–46.0)
Hemoglobin: 11.9 g/dL — ABNORMAL LOW (ref 12.0–15.0)
MCH: 30.2 pg (ref 26.0–34.0)
MCHC: 32.2 g/dL (ref 30.0–36.0)
MCV: 93.9 fL (ref 80.0–100.0)
Platelets: 188 K/uL (ref 150–400)
RBC: 3.94 MIL/uL (ref 3.87–5.11)
RDW: 12 % (ref 11.5–15.5)
WBC: 14.7 K/uL — ABNORMAL HIGH (ref 4.0–10.5)
nRBC: 0 % (ref 0.0–0.2)

## 2022-01-15 LAB — GLUCOSE, CAPILLARY
Glucose-Capillary: 158 mg/dL — ABNORMAL HIGH (ref 70–99)
Glucose-Capillary: 204 mg/dL — ABNORMAL HIGH (ref 70–99)

## 2022-01-15 MED ORDER — FAMOTIDINE 20 MG PO TABS
20.0000 mg | ORAL_TABLET | Freq: Two times a day (BID) | ORAL | Status: DC
  Administered 2022-01-15: 20 mg via ORAL
  Filled 2022-01-15: qty 1

## 2022-01-15 MED ORDER — OXYCODONE-ACETAMINOPHEN 5-325 MG PO TABS: 1.0000 | ORAL_TABLET | Freq: Four times a day (QID) | ORAL | 0 refills | Status: DC | PRN

## 2022-01-15 MED ORDER — CLOPIDOGREL BISULFATE 75 MG PO TABS: ORAL_TABLET | ORAL | 6 refills | Status: DC

## 2022-01-15 NOTE — Progress Notes (Signed)
PHARMACIST - PHYSICIAN COMMUNICATION  CONCERNING: IV to Oral Route Change Policy  RECOMMENDATION: This patient is receiving famotidine by the intravenous route.  Based on criteria approved by the Pharmacy and Therapeutics Committee, the intravenous medication(s) is/are being converted to the equivalent oral dose form(s).   DESCRIPTION: These criteria include: The patient is eating (either orally or via tube) and/or has been taking other orally administered medications for a least 24 hours The patient has no evidence of active gastrointestinal bleeding or impaired GI absorption (gastrectomy, short bowel, patient on TNA or NPO).  If you have questions about this conversion, please contact the [place], [facility] 01/15/2022 7:37 AM

## 2022-01-15 NOTE — Inpatient Diabetes Management (Signed)
Inpatient Diabetes Program Recommendations  AACE/ADA: New Consensus Statement on Inpatient Glycemic Control (2015)  Target Ranges:  Prepandial:   less than 140 mg/dL      Peak postprandial:   less than 180 mg/dL (1-2 hours)      Critically ill patients:  140 - 180 mg/dL   Lab Results  Component Value Date   GLUCAP 204 (H) 01/15/2022    Review of Glycemic Control  Diabetes history: DM2 Outpatient Diabetes medications: Levemir 26 units QD, Metformin 500 mg BID, Amaryl 4 mg QD Current orders for Inpatient glycemic control: Levemir 26 units QD, Amaryl 4 mg QD  Inpatient Diabetes Program Recommendations:    Please consider, Noovlog 0-9 units TID.  Will continue to follow while inpatient.  Thank you, Reche Dixon, MSN, [place] Diabetes Coordinator Inpatient Diabetes Program [phone] (team pager from 8a-5p)

## 2022-01-15 NOTE — Discharge Summary (Signed)
[facility]    Discharge Summary    Patient ID:  Kristin Hughes MRN: [id number] DOB/AGE: Feb 27, 1951 71 y.o.  Admit date: 01/14/2022 Discharge date: 01/15/2022 Date of Surgery: 01/14/2022 Surgeon: Surgeon(s): Dew, Erskine Squibb, MD Schnier, Dolores Lory, MD  Admission Diagnosis: AAA (abdominal aortic aneurysm) without rupture [facility]) [I71.40]  Discharge Diagnoses:  AAA (abdominal aortic aneurysm) without rupture ([place]) [I71.40]  Secondary Diagnoses: Past Medical History:  Diagnosis Date   AAA (abdominal aortic aneurysm) ([place]) 11/09/2021   a.) CTA abd/pelvis 11/09/2021: 5.3 x 5.2 cm fusiform AAA   Aortic atherosclerosis (HCC)    COPD (chronic obstructive pulmonary disease) (HCC)    Coronary artery disease    a.) Lexi 12/20/2021: EF >75%, LAD and LCx calcifications; no ischemia.   Diverticulosis    GERD (gastroesophageal reflux disease)    Hypertension    Left upper lobe pulmonary nodule 11/29/2020   a.) LDCT 11/29/2020: 4.1 mm subpleural LUL nodule.   Mixed hyperlipidemia    RBBB    Smokers' cough ([place])    Tobacco use    Type 2 diabetes mellitus treated with insulin ([place])     Procedure(s): ENDOVASCULAR REPAIR/STENT GRAFT  Discharged Condition: good  HPI:  Kristin Hughes is a 71 year old female that presented to [facility] on [facility]/14/2022 for treatment of a greater than 5 cm abdominal aortic aneurysm.  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  Due to this he require coil embolization of the right internal iliac artery.  Today, the patient denies any significant issues.  She denies any significant bleeding or pain.  Overall she has progressed well throughout the evening.  Hospital Course:  Kristin Hughes is a 71 y.o. female is S/P abdominal aortic aneurysm endovascular aortic repair Procedure(s): ENDOVASCULAR REPAIR/STENT GRAFT Extubated: POD # 0 Physical exam: Bilateral  groins soft with no oozing, 1+ DP/PT palpable pulses bilaterally Post-op wounds clean, dry, intact or healing well Pt. Ambulating, voiding and taking PO diet without difficulty. Pt pain controlled with PO pain meds. Labs as below Complications:none  Consults:    Significant Diagnostic Studies: CBC Lab Results  Component Value Date   WBC 14.7 (H) 01/15/2022   HGB 11.9 (L) 01/15/2022   HCT 37.0 01/15/2022   MCV 93.9 01/15/2022   PLT 188 01/15/2022    BMET    Component Value Date/Time   NA 141 01/15/2022 0458   K 4.2 01/15/2022 0458   CL 109 01/15/2022 0458   CO2 25 01/15/2022 0458   GLUCOSE 164 (H) 01/15/2022 0458   BUN 10 01/15/2022 0458   CREATININE 0.94 01/15/2022 0458   CALCIUM 8.8 (L) 01/15/2022 0458   GFRNONAA >60 01/15/2022 0458   COAG No results found for: "INR", "PROTIME"   Disposition:  Discharge to :Home  Allergies as of 01/15/2022       Reactions   Other    ORANGES/ CAUSES SWELLING IN EYES AND LIPS   Orange Oil    Other reaction(s): eyes, lips swells        Medication List     TAKE these medications    amLODipine 5 MG tablet Commonly known as: NORVASC Take 5 mg by mouth every morning.   aspirin EC 81 MG tablet Take 81 mg by mouth daily.   atorvastatin 40 MG tablet Commonly known as: LIPITOR Take 40 mg by mouth every morning.   cetirizine 10 MG tablet Commonly known as: ZYRTEC Take 10  mg by mouth every morning.   CINNAMON PO Take 2,000 mg by mouth at bedtime.   clopidogrel 75 MG tablet Commonly known as: PLAVIX Take 1 tablet by mouth (75 mg) daily Start taking on: January 16, 2022   Fish Oil 1200 MG Caps Take 2,400 mg by mouth at bedtime.   glimepiride 4 MG tablet Commonly known as: AMARYL Take 4 mg by mouth daily with breakfast.   Ibuprofen-diphenhydrAMINE Cit 200-38 MG Tabs Take 1 tablet by mouth at bedtime as needed (for sleep.).   Levemir FlexTouch 100 UNIT/ML FlexPen Generic drug: insulin detemir Inject 26 Units  into the skin at bedtime.   lisinopril 40 MG tablet Commonly known as: ZESTRIL Take 40 mg by mouth every morning.   metFORMIN 500 MG tablet Commonly known as: GLUCOPHAGE Take 500 mg by mouth 2 (two) times daily with a meal.   multivitamin tablet Take 1 tablet by mouth daily. CENTRUM SILVER   oxyCODONE-acetaminophen 5-325 MG tablet Commonly known as: PERCOCET/ROXICET Take 1-2 tablets by mouth every 6 (six) hours as needed for moderate pain.       Verbal and written Discharge instructions given to the patient. Wound care per Discharge AVS  Follow-up Information     Kris Hartmann, NP Follow up in 3 week(s).   Specialty: Vascular Surgery Why: See JD/FB in 3 to 4 weeks with EVAR Contact information: [place] [address] [phone]                 Signed: Kris Hartmann, NP  01/15/2022, 12:14 PM

## 2022-01-17 ENCOUNTER — Encounter: Payer: Self-pay | Admitting: Vascular Surgery

## 2022-01-24 ENCOUNTER — Ambulatory Visit: Payer: Medicare Other | Admitting: Medical

## 2022-02-04 ENCOUNTER — Ambulatory Visit (INDEPENDENT_AMBULATORY_CARE_PROVIDER_SITE_OTHER): Payer: Medicare Other | Admitting: Internal Medicine

## 2022-02-04 ENCOUNTER — Encounter: Payer: Self-pay | Admitting: Internal Medicine

## 2022-02-04 VITALS — BP 134/76 | HR 102 | Temp 97.7°F | Ht 64.0 in | Wt 148.4 lb

## 2022-02-04 DIAGNOSIS — F17210 Nicotine dependence, cigarettes, uncomplicated: Secondary | ICD-10-CM | POA: Diagnosis not present

## 2022-02-04 DIAGNOSIS — J449 Chronic obstructive pulmonary disease, unspecified: Secondary | ICD-10-CM

## 2022-02-04 DIAGNOSIS — I10 Essential (primary) hypertension: Secondary | ICD-10-CM | POA: Diagnosis not present

## 2022-02-04 MED ORDER — OLMESARTAN MEDOXOMIL 40 MG PO TABS: 40.0000 mg | ORAL_TABLET | Freq: Every day | ORAL | 11 refills | Status: DC

## 2022-02-04 NOTE — Patient Instructions (Addendum)
Stop lisinopril and start olmesartan 40 mg one daily   The key is to stop smoking completely before smoking completely stops you -it's not too late  Only use your albuterol as a rescue medication to be used if you can't catch your breath by resting or doing a relaxed purse lip breathing pattern.  - The less you use it, the better it will work when you need it. - Ok to use up to 2 puffs  every 4 hours if you must but call for immediate appointment if use goes up over your usual need - Don't leave home without it !!  (think of it like the spare tire for your car)   Ok to try albuterol 15 min before an activity (on alternating days)  that you know would usually make you short of breath and see if it makes any difference and if makes none then don't take albuterol after activity unless you can't catch your breath as this means it's the resting that helps, not the albuterol.   I recommend you get low dose CT chest yearly as part of your overall yearly check up   My office will be contacting you by phone for [facility] on same day you go back to see your doctor about your aneurysm repair  - if you don't hear back from my office within one week please call us back or notify us thru MyChart and we'll address it right away.  I will call with results and recommendations

## 2022-02-04 NOTE — Assessment & Plan Note (Signed)
Active smoker with clinically very mild dz but marked upper airway wheezing on initial pulmonary eval  02/04/2022  - 02/04/2022 try off acei   In meantime ok to use saba prn: Re SABA :  I spent extra time with pt today reviewing appropriate use of albuterol for prn use on exertion with the following points: 1) saba is for relief of sob that does not improve by walking a slower pace or resting but rather if the pt does not improve after trying this first. 2) If the pt is convinced, as many are, that saba helps recover from activity faster then it's easy to tell if this is the case by re-challenging : ie stop, take the inhaler, then p 5 minutes try the exact same activity (intensity of workload) that just caused the symptoms and see if they are substantially diminished or not after saba 3) if there is an activity that reproducibly causes the symptoms, try the saba 15 min before the activity on alternate days   If in fact the saba really does help, then fine to continue to use it prn but advised may need to look closer at the maintenance regimen being used(presently none and to be determined based on response to trial off acei and pfts)  to achieve better control of airways disease with exertion.   Clinically though copd is mild.  > 3 min discussion I reviewed the Fletcher curve with the patient that basically indicates  if you quit smoking when your best day FEV1 is still well preserved (as is clearly  the case here)  it is highly unlikely you will progress to severe disease and informed the patient there was  no medication on the market that has proven to alter the curve/ its downward trajectory  or the likelihood of progression of their disease(unlike other chronic medical conditions such as atheroclerosis where we do think we can change the natural hx with risk reducing meds)    Therefore stopping smoking and maintaining abstinence are  the most important aspects of her care, not choice of inhalers or for  that matter, doctors.   Treatment other than smoking cessation  is entirely directed by severity of symptoms and focused also on reducing exacerbations, not attempting to change the natural history of the disease.  Neither issue applies for now so f/u can be prn once pfts are completed

## 2022-02-04 NOTE — Progress Notes (Signed)
Kristin Hughes, female    DOB: [birth year]    MRN: [id number]   Brief patient profile:  29  yobf active smoker referred to pulmonary clinic in [place]  02/04/2022 by Dr Trixie Dredge  for emphysema.     History of Present Illness  02/04/2022  Pulmonary/ 1st office eval/ Stephan Draughn / [facility] on ACEi  Chief Complaint  Patient presents with   Consult    Ref for emphysema. Active smoker 1/2 ppd  Dyspnea:  walks dog x 5 min / just started on saba x 69month  seems to help if uses prior to walking dog  Cough: sometimes in am but sporadic daytime / min mucoid sputum Sleep: bed is flat / 2 pillows under head  SABA use: none   No obvious day to day or daytime pattern/variability or assoc excess/ purulent sputum or mucus plugs or hemoptysis or cp or chest tightness, subjective wheeze or overt sinus or hb symptoms.   Sleeping  without nocturnal  or early am exacerbation  of respiratory  c/o's or need for noct saba. Also denies any obvious fluctuation of symptoms with weather or environmental changes or other aggravating or alleviating factors except as outlined above   No unusual exposure hx or h/o childhood pna/ asthma or knowledge of premature birth.  Current Allergies, Complete Past Medical History, Past Surgical History, Family History, and Social History were reviewed in [employer] [facility].  ROS  The following are not active complaints unless bolded Hoarseness, sore throat, dysphagia, dental problems, itching, sneezing,  nasal congestion or discharge of excess mucus or purulent secretions, ear ache,   fever, chills, sweats, unintended wt loss or wt gain, classically pleuritic or exertional cp,  orthopnea pnd or arm/hand swelling  or leg swelling, presyncope, palpitations, abdominal pain, anorexia, nausea, vomiting, diarrhea  or change in bowel habits or change in bladder habits, change in stools or change in urine, dysuria, hematuria,  rash, arthralgias, visual  complaints, headache, numbness, weakness or ataxia or problems with walking or coordination,  change in mood or  memory.           Past Medical History:  Diagnosis Date   AAA (abdominal aortic aneurysm) ([facility] 11/09/2021   a.) CTA abd/pelvis 11/09/2021: 5.3 x 5.2 cm fusiform AAA   Aortic atherosclerosis (HCC)    COPD (chronic obstructive pulmonary disease) (HCC)    Coronary artery disease    a.) Lexi 12/20/2021: EF >75%, LAD and LCx calcifications; no ischemia.   Diverticulosis    GERD (gastroesophageal reflux disease)    Hypertension    Left upper lobe pulmonary nodule 11/29/2020   a.) LDCT 11/29/2020: 4.1 mm subpleural LUL nodule.   Mixed hyperlipidemia    RBBB    Sm[place]' cough [place]    Tobacco use    Type 2 diabetes mellitus treated with insulin (HCC)     Outpatient Medications Prior to Visit  Medication Sig Dispense Refill   amLODipine (NORVASC) 5 MG tablet Take 5 mg by mouth every morning.     aspirin EC 81 MG tablet Take 81 mg by mouth daily.     atorvastatin (LIPITOR) 40 MG tablet Take 40 mg by mouth every morning.     cetirizine (ZYRTEC) 10 MG tablet Take 10 mg by mouth every morning.     CINNAMON PO Take 2,000 mg by mouth at bedtime.     clopidogrel (PLAVIX) 75 MG tablet Take 1 tablet by mouth (75 mg) daily 30 tablet 6   glimepiride (AMARYL)  4 MG tablet Take 4 mg by mouth daily with breakfast.     Ibuprofen-diphenhydrAMINE Cit 200-38 MG TABS Take 1 tablet by mouth at bedtime as needed (for sleep.).     LEVEMIR FLEXTOUCH 100 UNIT/ML Pen Inject 26 Units into the skin at bedtime.      lisinopril (PRINIVIL,ZESTRIL) 40 MG tablet Take 40 mg by mouth every morning.     metFORMIN (GLUCOPHAGE) 500 MG tablet Take 500 mg by mouth 2 (two) times daily with a meal.      Multiple Vitamin (MULTIVITAMIN) tablet Take 1 tablet by mouth daily. CENTRUM SILVER     Omega-3 Fatty Acids (FISH OIL) 1200 MG CAPS Take 2,400 mg by mouth at bedtime.      oxyCODONE-acetaminophen (PERCOCET/ROXICET)  5-325 MG tablet Take 1-2 tablets by mouth every 6 (six) hours as needed for moderate pain. (Patient not taking: Reported on 02/04/2022) 20 tablet 0   No facility-administered medications prior to visit.     Objective:     BP 134/76 (BP Location: Right Arm, Patient Position: Sitting)   Pulse (!) 102   Temp 97.7 F (36.5 C) (Temporal)   Ht '5\' 4"'$  (1.626 m)   Wt 148 lb 6.4 oz (67.3 kg)   SpO2 97% Comment: ra  BMI 25.47 kg/m   SpO2: 97 % (ra)  Elderly bf a bit unusual affect "don't know why I'm here" with prominent pseudowheeze present    HEENT : Oropharynx  clear /edentulous  Nasal turbinates nl    NECK :  without  apparent JVD/ palpable Nodes/TM    LUNGS: no acc muscle use,  Min barrel  contour chest wall with bilateral  slightly decreased bs s audible wheeze and  without cough on insp or exp maneuvers and min  Hyperresonant  to  percussion bilaterally    CV:  RRR  no s3 or murmur or increase in P2, and no edema   ABD:  soft and nontender with pos end  insp Hoover's  in the supine position.  No bruits or organomegaly appreciated   MS:  Nl gait/ ext warm without deformities Or obvious joint restrictions  calf tenderness, cyanosis or clubbing     SKIN: warm and dry without lesions    NEURO:  alert, approp, nl sensorium with  no motor or cerebellar deficits apparent.         I personally reviewed images and agree with radiology impression as follows:   Chest LDSCT  11/29/20 Centrilobular and paraseptal emphysema. Scattered pulmonary parenchymal scarring. 4.1 mm for subpleural left upper lobe nodule, unchanged. No pleural fluid. Minimal debris in the airway.    Assessment   No problem-specific Assessment & Plan notes found for this encounter.     Christinia Gully, MD 02/04/2022

## 2022-02-04 NOTE — Assessment & Plan Note (Signed)
Try off acei 02/04/2022 for pseudowheeze   In the best review of chronic cough to date ( NEJM 2016 375 [id number]) ,  ACEi are now felt to cause cough in up to  20% of pts which is a 4 fold increase from previous reports and does not include the variety of non-specific complaints we see in pulmonary clinic in pts on ACEi but previously attributed to another dx like  Copd/asthma and  include PNDS, throat and chest congestion, "bronchitis", unexplained dyspnea and noct "strangling" sensations, and hoarseness, but also  atypical /refractory GERD symptoms like dysphagia and "bad heartburn"   The only way I know  to prove this is not an "ACEi Case" is a trial off ACEi x a minimum of 6 weeks then regroup.  >>> try benicar 40 mg daily in place of lisinopril

## 2022-02-04 NOTE — Assessment & Plan Note (Signed)
Counseled re importance of smoking cessation but did not meet time criteria for separate billing            Each maintenance medication was reviewed in detail including emphasizing most importantly the difference between maintenance and prns and under what circumstances the prns are to be triggered using an action plan format where appropriate.  Total time for H and P, chart review, counseling, reviewing hfa device(s) and generating customized AVS unique to this office visit / same day charting = > 45 min with new pt

## 2022-02-06 ENCOUNTER — Encounter: Payer: Self-pay | Admitting: *Deleted

## 2022-02-14 ENCOUNTER — Other Ambulatory Visit (INDEPENDENT_AMBULATORY_CARE_PROVIDER_SITE_OTHER): Payer: Self-pay | Admitting: Vascular Surgery

## 2022-02-14 DIAGNOSIS — Z8679 Personal history of other diseases of the circulatory system: Secondary | ICD-10-CM

## 2022-02-14 DIAGNOSIS — I714 Abdominal aortic aneurysm, without rupture, unspecified: Secondary | ICD-10-CM

## 2022-02-15 ENCOUNTER — Ambulatory Visit (INDEPENDENT_AMBULATORY_CARE_PROVIDER_SITE_OTHER): Payer: Medicare Other

## 2022-02-15 ENCOUNTER — Ambulatory Visit (INDEPENDENT_AMBULATORY_CARE_PROVIDER_SITE_OTHER): Payer: Medicare Other | Admitting: Nurse Practitioner

## 2022-02-15 ENCOUNTER — Encounter (INDEPENDENT_AMBULATORY_CARE_PROVIDER_SITE_OTHER): Payer: Self-pay | Admitting: Nurse Practitioner

## 2022-02-15 VITALS — BP 153/72 | HR 91 | Resp 16 | Wt 148.4 lb

## 2022-02-15 DIAGNOSIS — Z9889 Other specified postprocedural states: Secondary | ICD-10-CM | POA: Diagnosis not present

## 2022-02-15 DIAGNOSIS — Z720 Tobacco use: Secondary | ICD-10-CM

## 2022-02-15 DIAGNOSIS — I714 Abdominal aortic aneurysm, without rupture, unspecified: Secondary | ICD-10-CM

## 2022-02-15 DIAGNOSIS — Z8679 Personal history of other diseases of the circulatory system: Secondary | ICD-10-CM | POA: Diagnosis not present

## 2022-02-15 DIAGNOSIS — I7143 Infrarenal abdominal aortic aneurysm, without rupture: Secondary | ICD-10-CM

## 2022-02-15 DIAGNOSIS — E119 Type 2 diabetes mellitus without complications: Secondary | ICD-10-CM

## 2022-02-15 NOTE — Progress Notes (Incomplete)
Subjective:    Patient ID: Kristin Hughes, female    DOB: 09/16/1950, 70 y.o.   MRN: [id number] Chief Complaint  Patient presents with  . Follow-up    [facility] 3 week follow up    HPI  Review of Systems     Objective:   Physical Exam  BP (!) 153/72 (BP Location: Right Arm)   Pulse 91   Resp 16   Wt 148 lb 6.4 oz (67.3 kg)   BMI 25.47 kg/m   Past Medical History:  Diagnosis Date  . AAA (abdominal aortic aneurysm) ([place]) 11/09/2021   a.) CTA abd/pelvis 11/09/2021: 5.3 x 5.2 cm fusiform AAA  . Aortic atherosclerosis ([place])   . COPD (chronic obstructive pulmonary disease) ([place])   . Coronary artery disease    a.) Lexi 12/20/2021: EF >75%, LAD and LCx calcifications; no ischemia.  . Diverticulosis   . GERD (gastroesophageal reflux disease)   . Hypertension   . Left upper lobe pulmonary nodule 11/29/2020   a.) LDCT 11/29/2020: 4.1 mm subpleural LUL nodule.  . Mixed hyperlipidemia   . RBBB   . Smokers' cough ([place])   . Tobacco use   . Type 2 diabetes mellitus treated with insulin (HCC)     Social History   Socioeconomic History  . Marital status: Single    Spouse name: Not on file  . Number of children: Not on file  . Years of education: Not on file  . Highest education level: Not on file  Occupational History  . Not on file  Tobacco Use  . Smoking status: Every Day    Packs/day: 1.00    Years: 45.00    Total pack years: 45.00    Types: Cigarettes  . Smokeless tobacco: Never  Vaping Use  . Vaping Use: Never used  Substance and Sexual Activity  . Alcohol use: No  . Drug use: No  . Sexual activity: Not on file  Other Topics Concern  . Not on file  Social History Narrative  . Not on file   Social Determinants of Health   Financial Resource Strain: Not on file  Food Insecurity: Not on file  Transportation Needs: Not on file  Physical Activity: Not on file  Stress: Not on file  Social Connections: Not on file  Intimate Partner Violence: Not on file     Past Surgical History:  Procedure Laterality Date  . APPENDECTOMY  1970  . COLONOSCOPY N/A 02/19/2017   Procedure: COLONOSCOPY;  Surgeon: Daneil Dolin, MD;  [facility];  Service: Endoscopy;  Laterality: N/A;  8:15 am  . ENDOVASCULAR REPAIR/STENT GRAFT N/A 01/14/2022   Procedure: ENDOVASCULAR REPAIR/STENT GRAFT;  Surgeon: Algernon Huxley, MD;  Location: [facility];  Service: Cardiovascular;  Laterality: N/A;  . TUBAL LIGATION      Family History  Problem Relation Age of Onset  . Colon cancer Father     Allergies  Allergen Reactions  . Other     ORANGES/ CAUSES SWELLING IN EYES AND LIPS  . Orange Oil     Other reaction(s): eyes, lips swells Other reaction(s): eyes, lips swells       Latest Ref Rng & Units 01/15/2022    4:58 AM 01/11/2022   11:55 AM  CBC  WBC 4.0 - 10.5 K/uL 14.7  8.1   Hemoglobin 12.0 - 15.0 g/dL 11.9  14.2   Hematocrit 36.0 - 46.0 % 37.0  44.3   Platelets 150 - 400 K/uL 188  249       CMP     Component Value Date/Time   NA 141 01/15/2022 0458   K 4.2 01/15/2022 0458   CL 109 01/15/2022 0458   CO2 25 01/15/2022 0458   GLUCOSE 164 (H) 01/15/2022 0458   BUN 10 01/15/2022 0458   CREATININE 0.94 01/15/2022 0458   CALCIUM 8.8 (L) 01/15/2022 0458   GFRNONAA >60 01/15/2022 0458     No results found.     Assessment & Plan:   1. Infrarenal abdominal aortic aneurysm (AAA) without rupture (HCC) ***  2. Tobacco user ***  3. Type 2 diabetes mellitus without complication, without long-term current use of insulin (HCC) ***   Current Outpatient Medications on File Prior to Visit  Medication Sig Dispense Refill  . amLODipine (NORVASC) 5 MG tablet Take 5 mg by mouth every morning.    Marland Kitchen aspirin EC 81 MG tablet Take 81 mg by mouth daily.    Marland Kitchen atorvastatin (LIPITOR) 40 MG tablet Take 40 mg by mouth every morning.    . cetirizine (ZYRTEC) 10 MG tablet Take 10 mg by mouth every morning.    Marland Kitchen CINNAMON PO Take 2,000 mg by mouth  at bedtime.    . clopidogrel (PLAVIX) 75 MG tablet Take 1 tablet by mouth (75 mg) daily 30 tablet 6  . glimepiride (AMARYL) 4 MG tablet Take 4 mg by mouth daily with breakfast.    . Ibuprofen-diphenhydrAMINE Cit 200-38 MG TABS Take 1 tablet by mouth at bedtime as needed (for sleep.).    Marland Kitchen LEVEMIR FLEXTOUCH 100 UNIT/ML Pen Inject 26 Units into the skin at bedtime.     . metFORMIN (GLUCOPHAGE) 500 MG tablet Take 500 mg by mouth 2 (two) times daily with a meal.     . Multiple Vitamin (MULTIVITAMIN) tablet Take 1 tablet by mouth daily. CENTRUM SILVER    . olmesartan (BENICAR) 40 MG tablet Take 1 tablet (40 mg total) by mouth daily. 30 tablet 11  . Omega-3 Fatty Acids (FISH OIL) 1200 MG CAPS Take 2,400 mg by mouth at bedtime.     Marland Kitchen oxyCODONE-acetaminophen (PERCOCET/ROXICET) 5-325 MG tablet Take 1-2 tablets by mouth every 6 (six) hours as needed for moderate pain. (Patient not taking: Reported on 02/04/2022) 20 tablet 0   No current facility-administered medications on file prior to visit.    There are no Patient Instructions on file for this visit. No follow-ups on file.   Kris Hartmann, NP

## 2022-02-15 NOTE — Progress Notes (Signed)
Subjective:    Patient ID: Kristin Hughes, female    DOB: [birth year], 71 y.o.   MRN: [id number] Chief Complaint  Patient presents with   Follow-up    [facility] 3 week follow up    The patient returns to the office for surveillance of an abdominal aortic aneurysm status post stent graft placement on 01/14/2022.   Patient denies abdominal pain or back pain, no other abdominal complaints. No groin related complaints. No symptoms consistent with distal embolization No changes in claudication distance or new rest pain symptoms.  Does note some claudication in the right hip.  However the patient did have an embolization of the right internal iliac artery.  There have been no interval changes in his overall healthcare since his last visit.   Patient denies amaurosis fugax or TIA symptoms.  The patient denies recent episodes of angina or shortness of breath.   Duplex US of the aorta and iliac arteries shows a 5.01 AAA sac with no endoleak, no change in the sac compared to the previous study.     Review of Systems  Cardiovascular:        Claudication  All other systems reviewed and are negative.      Objective:   Physical Exam Vitals reviewed.  HENT:     Head: Normocephalic.  Cardiovascular:     Rate and Rhythm: Normal rate.     Pulses: Normal pulses.  Pulmonary:     Effort: Pulmonary effort is normal.  Skin:    General: Skin is warm and dry.  Neurological:     Mental Status: She is alert and oriented to person, place, and time.  Psychiatric:        Mood and Affect: Mood normal.        Behavior: Behavior normal.        Thought Content: Thought content normal.        Judgment: Judgment normal.     BP (!) 153/72 (BP Location: Right Arm)   Pulse 91   Resp 16   Wt 148 lb 6.4 oz (67.3 kg)   BMI 25.47 kg/m   Past Medical History:  Diagnosis Date   AAA (abdominal aortic aneurysm) ([place]) 11/09/2021   a.) CTA abd/pelvis 11/09/2021: 5.3 x 5.2 cm fusiform AAA   Aortic  atherosclerosis (HCC)    COPD (chronic obstructive pulmonary disease) (HCC)    Coronary artery disease    a.) Lexi 12/20/2021: EF >75%, LAD and LCx calcifications; no ischemia.   Diverticulosis    GERD (gastroesophageal reflux disease)    Hypertension    Left upper lobe pulmonary nodule 11/29/2020   a.) LDCT 11/29/2020: 4.1 mm subpleural LUL nodule.   Mixed hyperlipidemia    RBBB    Smokers' cough ([place])    Tobacco use    Type 2 diabetes mellitus treated with insulin (HCC)     Social History   Socioeconomic History   Marital status: Single    Spouse name: Not on file   Number of children: Not on file   Years of education: Not on file   Highest education level: Not on file  Occupational History   Not on file  Tobacco Use   Smoking status: Every Day    Packs/day: 1.00    Years: 45.00    Total pack years: 45.00    Types: Cigarettes   Smokeless tobacco: Never  Vaping Use   Vaping Use: Never used  Substance and Sexual Activity   Alcohol use: No  Drug use: No   Sexual activity: Not on file  Other Topics Concern   Not on file  Social History Narrative   Not on file   Social Determinants of Health   Financial Resource Strain: Not on file  Food Insecurity: Not on file  Transportation Needs: Not on file  Physical Activity: Not on file  Stress: Not on file  Social Connections: Not on file  Intimate Partner Violence: Not on file    Past Surgical History:  Procedure Laterality Date   APPENDECTOMY  1970   COLONOSCOPY N/A 02/19/2017   Procedure: COLONOSCOPY;  Surgeon: Daneil Dolin, MD;  [facility];  Service: Endoscopy;  Laterality: N/A;  8:15 am   ENDOVASCULAR REPAIR/STENT GRAFT N/A 01/14/2022   Procedure: ENDOVASCULAR REPAIR/STENT GRAFT;  Surgeon: Algernon Huxley, MD;  Location: [facility];  Service: Cardiovascular;  Laterality: N/A;   TUBAL LIGATION      Family History  Problem Relation Age of Onset   Colon cancer Father     Allergies   Allergen Reactions   Other     ORANGES/ CAUSES SWELLING IN EYES AND LIPS   Orange Oil     Other reaction(s): eyes, lips swells Other reaction(s): eyes, lips swells       Latest Ref Rng & Units 01/15/2022    4:58 AM 01/11/2022   11:55 AM  CBC  WBC 4.0 - 10.5 K/uL 14.7  8.1   Hemoglobin 12.0 - 15.0 g/dL 11.9  14.2   Hematocrit 36.0 - 46.0 % 37.0  44.3   Platelets 150 - 400 K/uL 188  249       CMP     Component Value Date/Time   NA 141 01/15/2022 0458   K 4.2 01/15/2022 0458   CL 109 01/15/2022 0458   CO2 25 01/15/2022 0458   GLUCOSE 164 (H) 01/15/2022 0458   BUN 10 01/15/2022 0458   CREATININE 0.94 01/15/2022 0458   CALCIUM 8.8 (L) 01/15/2022 0458   GFRNONAA >60 01/15/2022 0458     No results found.     Assessment & Plan:   1. Infrarenal abdominal aortic aneurysm (AAA) without rupture (HCC) Recommend:  Patient is status post successful endovascular repair of the AAA.   No further intervention is required at this time.   No endoleak is detected and the aneurysm sac is stable.  The patient will continue antiplatelet therapy as prescribed as well as aggressive management of hyperlipidemia. Exercise is again strongly encouraged.   However, endografts require continued surveillance with ultrasound or CT scan. This is mandatory to detect any changes that allow repressurization of the aneurysm sac.  The patient is informed that this would be asymptomatic.  The patient is reminded that lifelong routine surveillance is a necessity with an endograft. Patient will continue to follow-up at the specified interval with ultrasound of the aorta.  She will have the patient follow-up with ABIs as well.  Patient claudication symptoms are likely related to the embolization however she does have some significant risk factors for PAD.  We will ensure that there is no underlying disease.  2. Tobacco user Smoking cessation was discussed, 3-10 minutes spent on this topic specifically    3. Type 2 diabetes mellitus without complication, without long-term current use of insulin (HCC) Continue hypoglycemic medications as already ordered, these medications have been reviewed and there are no changes at this time.  Hgb A1C to be monitored as already arranged by primary service  Current Outpatient Medications on File Prior to Visit  Medication Sig Dispense Refill   amLODipine (NORVASC) 5 MG tablet Take 5 mg by mouth every morning.     aspirin EC 81 MG tablet Take 81 mg by mouth daily.     atorvastatin (LIPITOR) 40 MG tablet Take 40 mg by mouth every morning.     cetirizine (ZYRTEC) 10 MG tablet Take 10 mg by mouth every morning.     CINNAMON PO Take 2,000 mg by mouth at bedtime.     clopidogrel (PLAVIX) 75 MG tablet Take 1 tablet by mouth (75 mg) daily 30 tablet 6   glimepiride (AMARYL) 4 MG tablet Take 4 mg by mouth daily with breakfast.     Ibuprofen-diphenhydrAMINE Cit 200-38 MG TABS Take 1 tablet by mouth at bedtime as needed (for sleep.).     LEVEMIR FLEXTOUCH 100 UNIT/ML Pen Inject 26 Units into the skin at bedtime.      metFORMIN (GLUCOPHAGE) 500 MG tablet Take 500 mg by mouth 2 (two) times daily with a meal.      Multiple Vitamin (MULTIVITAMIN) tablet Take 1 tablet by mouth daily. CENTRUM SILVER     olmesartan (BENICAR) 40 MG tablet Take 1 tablet (40 mg total) by mouth daily. 30 tablet 11   Omega-3 Fatty Acids (FISH OIL) 1200 MG CAPS Take 2,400 mg by mouth at bedtime.      oxyCODONE-acetaminophen (PERCOCET/ROXICET) 5-325 MG tablet Take 1-2 tablets by mouth every 6 (six) hours as needed for moderate pain. (Patient not taking: Reported on 02/04/2022) 20 tablet 0   No current facility-administered medications on file prior to visit.    There are no Patient Instructions on file for this visit. No follow-ups on file.   Kris Hartmann, NP

## 2022-02-16 ENCOUNTER — Encounter (INDEPENDENT_AMBULATORY_CARE_PROVIDER_SITE_OTHER): Payer: Self-pay | Admitting: Nurse Practitioner

## 2022-03-22 LAB — HM DIABETES EYE EXAM

## 2022-03-28 ENCOUNTER — Encounter: Payer: Self-pay | Admitting: Family Medicine

## 2022-05-13 ENCOUNTER — Encounter (INDEPENDENT_AMBULATORY_CARE_PROVIDER_SITE_OTHER): Payer: Self-pay | Admitting: Nurse Practitioner

## 2022-05-13 ENCOUNTER — Ambulatory Visit (INDEPENDENT_AMBULATORY_CARE_PROVIDER_SITE_OTHER): Payer: Medicare Other | Admitting: Nurse Practitioner

## 2022-05-13 ENCOUNTER — Other Ambulatory Visit (INDEPENDENT_AMBULATORY_CARE_PROVIDER_SITE_OTHER): Payer: Medicare Other

## 2022-05-13 ENCOUNTER — Other Ambulatory Visit (INDEPENDENT_AMBULATORY_CARE_PROVIDER_SITE_OTHER): Payer: Self-pay | Admitting: Vascular Surgery

## 2022-05-13 ENCOUNTER — Other Ambulatory Visit (INDEPENDENT_AMBULATORY_CARE_PROVIDER_SITE_OTHER): Payer: Self-pay | Admitting: Nurse Practitioner

## 2022-05-13 VITALS — BP 136/73 | HR 93 | Resp 16 | Wt 152.0 lb

## 2022-05-13 DIAGNOSIS — I7143 Infrarenal abdominal aortic aneurysm, without rupture: Secondary | ICD-10-CM

## 2022-05-13 DIAGNOSIS — I739 Peripheral vascular disease, unspecified: Secondary | ICD-10-CM | POA: Diagnosis not present

## 2022-05-13 DIAGNOSIS — I10 Essential (primary) hypertension: Secondary | ICD-10-CM | POA: Diagnosis not present

## 2022-05-13 DIAGNOSIS — Z720 Tobacco use: Secondary | ICD-10-CM

## 2022-05-13 DIAGNOSIS — I714 Abdominal aortic aneurysm, without rupture, unspecified: Secondary | ICD-10-CM

## 2022-05-13 DIAGNOSIS — E119 Type 2 diabetes mellitus without complications: Secondary | ICD-10-CM | POA: Diagnosis not present

## 2022-05-19 ENCOUNTER — Encounter (INDEPENDENT_AMBULATORY_CARE_PROVIDER_SITE_OTHER): Payer: Self-pay | Admitting: Nurse Practitioner

## 2022-05-19 NOTE — Progress Notes (Signed)
Subjective:    Patient ID: Kristin Hughes, female    DOB: 07-23-1950, 71 y.o.   MRN: [id number] Chief Complaint  Patient presents with   Follow-up    Ultrasound follow up    The patient returns to the office for surveillance of an abdominal aortic aneurysm status post stent graft placement on 07//2023.   Patient denies abdominal pain or back pain, no other abdominal complaints. No groin related complaints. No symptoms consistent with distal embolization No changes in claudication distance or new rest pain symptoms.   There have been no interval changes in his overall healthcare since his last visit.   Patient denies amaurosis fugax or TIA symptoms.  The patient denies recent episodes of angina or shortness of breath.   Duplex US of the aorta and iliac arteries shows a 4.15 AAA sac with no endoleak, decrease in the sac compared to the previous study.    Review of Systems  Review of Systems: Negative Unless Checked Constitutional: '[]'$ Weight loss  '[]'$ Fever  '[]'$ Chills Cardiac: '[]'$ Chest pain   '[]'$  Atrial Fibrillation  '[]'$ Palpitations   '[]'$ Shortness of breath when laying flat   '[]'$ Shortness of breath with exertion. '[]'$ Shortness of breath at rest Vascular:  '[]'$ Pain in legs with walking   '[]'$ Pain in legs with standing '[]'$ Pain in legs when laying flat   '[]'$ Claudication    '[]'$ Pain in feet when laying flat    '[]'$ History of DVT   '[]'$ Phlebitis   '[]'$ Swelling in legs   '[]'$ Varicose veins   '[]'$ Non-healing ulcers Pulmonary:   '[]'$ Uses home oxygen   '[]'$ Productive cough   '[]'$ Hemoptysis   '[]'$ Wheeze  '[]'$ COPD   '[]'$ Asthma Neurologic:  '[]'$ Dizziness   '[]'$ Seizures  '[]'$ Blackouts '[]'$ History of stroke   '[]'$ History of TIA  '[]'$ Aphasia   '[]'$ Temporary Blindness   '[]'$ Weakness or numbness in arm   '[]'$ Weakness or numbness in leg Musculoskeletal:   '[]'$ Joint swelling   '[]'$ Joint pain   '[]'$ Low back pain  '[]'$  History of Knee Replacement '[]'$ Arthritis '[]'$ back Surgeries  '[]'$  Spinal Stenosis    Hematologic:  '[]'$ Easy bruising  '[]'$ Easy bleeding   '[]'$ Hypercoagulable state    '[]'$ Anemic Gastrointestinal:  '[]'$ Diarrhea   '[]'$ Vomiting  '[]'$ Gastroesophageal reflux/heartburn   '[]'$ Difficulty swallowing. '[]'$ Abdominal pain Genitourinary:  '[]'$ Chronic kidney disease   '[]'$ Difficult urination  '[]'$ Anuric   '[]'$ Blood in urine '[]'$ Frequent urination  '[]'$ Burning with urination   '[]'$ Hematuria Skin:  '[]'$ Rashes   '[]'$ Ulcers '[]'$ Wounds Psychological:  '[]'$ History of anxiety   '[]'$  History of major depression  '[]'$  Memory Difficulties     Objective:   Physical Exam Vitals reviewed.  HENT:     Head: Normocephalic.  Cardiovascular:     Rate and Rhythm: Normal rate.     Pulses: Normal pulses.  Pulmonary:     Effort: Pulmonary effort is normal.  Skin:    General: Skin is warm and dry.  Neurological:     Mental Status: She is alert and oriented to person, place, and time.  Psychiatric:        Mood and Affect: Mood normal.        Behavior: Behavior normal.        Thought Content: Thought content normal.        Judgment: Judgment normal.     BP 136/73 (BP Location: Left Arm)   Pulse 93   Resp 16   Wt 152 lb (68.9 kg)   BMI 26.09 kg/m   Past Medical History:  Diagnosis Date   AAA (abdominal aortic aneurysm) ([place]) 11/09/2021   a.) CTA abd/pelvis 11/09/2021:  5.3 x 5.2 cm fusiform AAA   Aortic atherosclerosis (HCC)    COPD (chronic obstructive pulmonary disease) (HCC)    Coronary artery disease    a.) Lexi 12/20/2021: EF >75%, LAD and LCx calcifications; no ischemia.   Diverticulosis    GERD (gastroesophageal reflux disease)    Hypertension    Left upper lobe pulmonary nodule 11/29/2020   a.) LDCT 11/29/2020: 4.1 mm subpleural LUL nodule.   Mixed hyperlipidemia    RBBB    Smokers' cough ([place])    Tobacco use    Type 2 diabetes mellitus treated with insulin (HCC)     Social History   Socioeconomic History   Marital status: Single    Spouse name: Not on file   Number of children: Not on file   Years of education: Not on file   Highest education level: Not on file  Occupational History    Not on file  Tobacco Use   Smoking status: Every Day    Packs/day: 1.00    Years: 45.00    Total pack years: 45.00    Types: Cigarettes   Smokeless tobacco: Never  Vaping Use   Vaping Use: Never used  Substance and Sexual Activity   Alcohol use: No   Drug use: No   Sexual activity: Not on file  Other Topics Concern   Not on file  Social History Narrative   Not on file   Social Determinants of Health   Financial Resource Strain: Not on file  Food Insecurity: Not on file  Transportation Needs: Not on file  Physical Activity: Not on file  Stress: Not on file  Social Connections: Not on file  Intimate Partner Violence: Not on file    Past Surgical History:  Procedure Laterality Date   APPENDECTOMY  1970   COLONOSCOPY N/A 02/19/2017   Procedure: COLONOSCOPY;  Surgeon: Daneil Dolin, MD;  [facility];  Service: Endoscopy;  Laterality: N/A;  8:15 am   ENDOVASCULAR REPAIR/STENT GRAFT N/A 01/14/2022   Procedure: ENDOVASCULAR REPAIR/STENT GRAFT;  Surgeon: Algernon Huxley, MD;  Location: [facility];  Service: Cardiovascular;  Laterality: N/A;   TUBAL LIGATION      Family History  Problem Relation Age of Onset   Colon cancer Father     Allergies  Allergen Reactions   Other     ORANGES/ CAUSES SWELLING IN EYES AND LIPS   Orange Oil     Other reaction(s): eyes, lips swells Other reaction(s): eyes, lips swells       Latest Ref Rng & Units 01/15/2022    4:58 AM 01/11/2022   11:55 AM  CBC  WBC 4.0 - 10.5 K/uL 14.7  8.1   Hemoglobin 12.0 - 15.0 g/dL 11.9  14.2   Hematocrit 36.0 - 46.0 % 37.0  44.3   Platelets 150 - 400 K/uL 188  249       CMP     Component Value Date/Time   NA 141 01/15/2022 0458   K 4.2 01/15/2022 0458   CL 109 01/15/2022 0458   CO2 25 01/15/2022 0458   GLUCOSE 164 (H) 01/15/2022 0458   BUN 10 01/15/2022 0458   CREATININE 0.94 01/15/2022 0458   CALCIUM 8.8 (L) 01/15/2022 0458   GFRNONAA >60 01/15/2022 0458     No  results found.     Assessment & Plan:   1. Abdominal aortic aneurysm (AAA) without rupture, unspecified part ([place]) Recommend:  Patient is status post successful endovascular repair  of the AAA.   No further intervention is required at this time.   No endoleak is detected and the aneurysm sac is stable.  The patient will continue antiplatelet therapy as prescribed as well as aggressive management of hyperlipidemia. Exercise is again strongly encouraged.   However, endografts require continued surveillance with ultrasound or CT scan. This is mandatory to detect any changes that allow repressurization of the aneurysm sac.  The patient is informed that this would be asymptomatic.  The patient is reminded that lifelong routine surveillance is a necessity with an endograft. Patient will continue to follow-up at the specified interval with ultrasound of the aorta.  2. Tobacco user Smoking cessation was discussed, 3-10 minutes spent on this topic specifically  3. Type 2 diabetes mellitus without complication, without long-term current use of insulin (HCC) Continue hypoglycemic medications as already ordered, these medications have been reviewed and there are no changes at this time.  Hgb A1C to be monitored as already arranged by primary service  4. Benign essential hypertension Continue antihypertensive medications as already ordered, these medications have been reviewed and there are no changes at this time.   Current Outpatient Medications on File Prior to Visit  Medication Sig Dispense Refill   amLODipine (NORVASC) 5 MG tablet Take 5 mg by mouth every morning.     aspirin EC 81 MG tablet Take 81 mg by mouth daily.     atorvastatin (LIPITOR) 40 MG tablet Take 40 mg by mouth every morning.     cetirizine (ZYRTEC) 10 MG tablet Take 10 mg by mouth every morning.     CINNAMON PO Take 2,000 mg by mouth at bedtime.     clopidogrel (PLAVIX) 75 MG tablet Take 1 tablet by mouth (75 mg) daily 30  tablet 6   glimepiride (AMARYL) 4 MG tablet Take 4 mg by mouth daily with breakfast.     Ibuprofen-diphenhydrAMINE Cit 200-38 MG TABS Take 1 tablet by mouth at bedtime as needed (for sleep.).     LEVEMIR FLEXTOUCH 100 UNIT/ML Pen Inject 26 Units into the skin at bedtime.      metFORMIN (GLUCOPHAGE) 500 MG tablet Take 500 mg by mouth 2 (two) times daily with a meal.      Multiple Vitamin (MULTIVITAMIN) tablet Take 1 tablet by mouth daily. CENTRUM SILVER     olmesartan (BENICAR) 40 MG tablet Take 1 tablet (40 mg total) by mouth daily. 30 tablet 11   Omega-3 Fatty Acids (FISH OIL) 1200 MG CAPS Take 2,400 mg by mouth at bedtime.      oxyCODONE-acetaminophen (PERCOCET/ROXICET) 5-325 MG tablet Take 1-2 tablets by mouth every 6 (six) hours as needed for moderate pain. (Patient not taking: Reported on 02/04/2022) 20 tablet 0   No current facility-administered medications on file prior to visit.    There are no Patient Instructions on file for this visit. No follow-ups on file.   Kris Hartmann, NP

## 2022-07-24 ENCOUNTER — Other Ambulatory Visit (INDEPENDENT_AMBULATORY_CARE_PROVIDER_SITE_OTHER): Payer: Self-pay | Admitting: Nurse Practitioner

## 2022-11-06 ENCOUNTER — Other Ambulatory Visit (INDEPENDENT_AMBULATORY_CARE_PROVIDER_SITE_OTHER): Payer: Self-pay | Admitting: Nurse Practitioner

## 2022-11-06 DIAGNOSIS — I7143 Infrarenal abdominal aortic aneurysm, without rupture: Secondary | ICD-10-CM

## 2022-11-11 ENCOUNTER — Encounter (INDEPENDENT_AMBULATORY_CARE_PROVIDER_SITE_OTHER): Payer: Self-pay | Admitting: Nurse Practitioner

## 2022-11-11 ENCOUNTER — Ambulatory Visit (INDEPENDENT_AMBULATORY_CARE_PROVIDER_SITE_OTHER): Payer: Medicare Other | Admitting: Nurse Practitioner

## 2022-11-11 ENCOUNTER — Ambulatory Visit (INDEPENDENT_AMBULATORY_CARE_PROVIDER_SITE_OTHER): Payer: Medicare Other

## 2022-11-11 VITALS — BP 162/78 | HR 90 | Resp 16 | Wt 160.4 lb

## 2022-11-11 DIAGNOSIS — E119 Type 2 diabetes mellitus without complications: Secondary | ICD-10-CM

## 2022-11-11 DIAGNOSIS — Z720 Tobacco use: Secondary | ICD-10-CM | POA: Diagnosis not present

## 2022-11-11 DIAGNOSIS — I10 Essential (primary) hypertension: Secondary | ICD-10-CM

## 2022-11-11 DIAGNOSIS — I7143 Infrarenal abdominal aortic aneurysm, without rupture: Secondary | ICD-10-CM | POA: Diagnosis not present

## 2022-11-11 NOTE — Progress Notes (Signed)
Subjective:    Patient ID: Kristin Hughes, female    DOB: 1951/04/18, 73 y.o.   MRN: [id number] Chief Complaint  Patient presents with   Follow-up    Ultrasound follow up    The patient returns to the office for surveillance of an abdominal aortic aneurysm status post stent graft placement on 07//2023.    Patient denies abdominal pain or back pain, no other abdominal complaints. No groin related complaints. No symptoms consistent with distal embolization No changes in claudication distance or new rest pain symptoms.    There have been no interval changes in his overall healthcare since his last visit.    Patient denies amaurosis fugax or TIA symptoms.  The patient denies recent episodes of angina or shortness of breath.    Duplex US of the aorta and iliac arteries shows a 5.1 cm AAA sac with no endoleak, this is a noted increase in sac size from the previous study, however it is suspected that the previous study was underestimated.  The patient CTA on 11/09/2021 showed an aneurysm of 5.3 cm, the ultrasound on 02/15/2022 showed an aneurysm of 5 cm, the subsequent ultrasound 04/2022 showed 4.5 cm     Review of Systems  Cardiovascular:  Negative for leg swelling.  All other systems reviewed and are negative.      Objective:   Physical Exam Vitals reviewed.  HENT:     Head: Normocephalic.  Cardiovascular:     Rate and Rhythm: Normal rate.     Pulses:          Dorsalis pedis pulses are 1+ on the right side and 1+ on the left side.       Posterior tibial pulses are 1+ on the right side and 1+ on the left side.  Pulmonary:     Effort: Pulmonary effort is normal.  Skin:    General: Skin is warm and dry.  Neurological:     Mental Status: She is alert and oriented to person, place, and time.  Psychiatric:        Mood and Affect: Mood normal.        Behavior: Behavior normal.        Thought Content: Thought content normal.        Judgment: Judgment normal.     BP (!)  162/78 (BP Location: Left Arm)   Pulse 90   Resp 16   Wt 160 lb 6.4 oz (72.8 kg)   BMI 27.53 kg/m   Past Medical History:  Diagnosis Date   AAA (abdominal aortic aneurysm) (HCC) 11/09/2021   a.) CTA abd/pelvis 11/09/2021: 5.3 x 5.2 cm fusiform AAA   Aortic atherosclerosis (HCC)    COPD (chronic obstructive pulmonary disease) (HCC)    Coronary artery disease    a.) Lexi 12/20/2021: EF >75%, LAD and LCx calcifications; no ischemia.   Diverticulosis    GERD (gastroesophageal reflux disease)    Hypertension    Left upper lobe pulmonary nodule 11/29/2020   a.) LDCT 11/29/2020: 4.1 mm subpleural LUL nodule.   Mixed hyperlipidemia    RBBB    Smokers' cough (HCC)    Tobacco use    Type 2 diabetes mellitus treated with insulin (HCC)     Social History   Socioeconomic History   Marital status: Single    Spouse name: Not on file   Number of children: Not on file   Years of education: Not on file   Highest education level: Not on file  Occupational  History   Not on file  Tobacco Use   Smoking status: Every Day    Packs/day: 1.00    Years: 45.00    Additional pack years: 0.00    Total pack years: 45.00    Types: Cigarettes   Smokeless tobacco: Never  Vaping Use   Vaping Use: Never used  Substance and Sexual Activity   Alcohol use: No   Drug use: No   Sexual activity: Not on file  Other Topics Concern   Not on file  Social History Narrative   Not on file   Social Determinants of Health   Financial Resource Strain: Not on file  Food Insecurity: Not on file  Transportation Needs: Not on file  Physical Activity: Not on file  Stress: Not on file  Social Connections: Not on file  Intimate Partner Violence: Not on file    Past Surgical History:  Procedure Laterality Date   APPENDECTOMY  1970   COLONOSCOPY N/A 02/19/2017   Procedure: COLONOSCOPY;  Surgeon: Corbin Ade, MD;  [facility];  Service: Endoscopy;  Laterality: N/A;  8:15 am   ENDOVASCULAR  REPAIR/STENT GRAFT N/A 01/14/2022   Procedure: ENDOVASCULAR REPAIR/STENT GRAFT;  Surgeon: Annice Needy, MD;  Location: [facility];  Service: Cardiovascular;  Laterality: N/A;   TUBAL LIGATION      Family History  Problem Relation Age of Onset   Colon cancer Father     Allergies  Allergen Reactions   Other     ORANGES/ CAUSES SWELLING IN EYES AND LIPS   Orange Oil     Other reaction(s): eyes, lips swells Other reaction(s): eyes, lips swells       Latest Ref Rng & Units 01/15/2022    4:58 AM 01/11/2022   11:55 AM  CBC  WBC 4.0 - 10.5 K/uL 14.7  8.1   Hemoglobin 12.0 - 15.0 g/dL 16.1  09.6   Hematocrit 36.0 - 46.0 % 37.0  44.3   Platelets 150 - 400 K/uL 188  249       CMP     Component Value Date/Time   NA 141 01/15/2022 0458   K 4.2 01/15/2022 0458   CL 109 01/15/2022 0458   CO2 25 01/15/2022 0458   GLUCOSE 164 (H) 01/15/2022 0458   BUN 10 01/15/2022 0458   CREATININE 0.94 01/15/2022 0458   CALCIUM 8.8 (L) 01/15/2022 0458   GFRNONAA >60 01/15/2022 0458     No results found.     Assessment & Plan:   1. Infrarenal abdominal aortic aneurysm (AAA) without rupture (HCC) Recommend:  Patient is status post successful endovascular repair of the AAA.   No further intervention is required at this time.   No endoleak is detected and the aneurysm sac is stable.  The patient will continue antiplatelet therapy as prescribed as well as aggressive management of hyperlipidemia. Exercise is again strongly encouraged.   However, endografts require continued surveillance with ultrasound or CT scan. This is mandatory to detect any changes that allow repressurization of the aneurysm sac.  The patient is informed that this would be asymptomatic.  The patient is reminded that lifelong routine surveillance is a necessity with an endograft. Patient will continue to follow-up at the specified interval with ultrasound of the aorta.  Due to the discrepancy in sizes from the  previous studies we will have the patient follow-up in 6 months again.  I suspect this is not a sign of growth but rather an over  estimation.  However if the size remains stable we will likely have the patient return to annual follow-ups.  2. Tobacco user Smoking cessation was discussed, 3-10 minutes spent on this topic specifically  3. Type 2 diabetes mellitus without complication, without long-term current use of insulin (HCC) Continue hypoglycemic medications as already ordered, these medications have been reviewed and there are no changes at this time.  Hgb A1C to be monitored as already arranged by primary service  4. Benign essential hypertension Continue antihypertensive medications as already ordered, these medications have been reviewed and there are no changes at this time.   Current Outpatient Medications on File Prior to Visit  Medication Sig Dispense Refill   amLODipine (NORVASC) 5 MG tablet Take 5 mg by mouth every morning.     aspirin EC 81 MG tablet Take 81 mg by mouth daily.     atorvastatin (LIPITOR) 40 MG tablet Take 40 mg by mouth every morning.     cetirizine (ZYRTEC) 10 MG tablet Take 10 mg by mouth every morning.     CINNAMON PO Take 2,000 mg by mouth at bedtime.     clopidogrel (PLAVIX) 75 MG tablet TAKE 1 TABLET BY MOUTH ONCE DAILY. 30 tablet 6   glimepiride (AMARYL) 4 MG tablet Take 4 mg by mouth daily with breakfast.     Ibuprofen-diphenhydrAMINE Cit 200-38 MG TABS Take 1 tablet by mouth at bedtime as needed (for sleep.).     LEVEMIR FLEXTOUCH 100 UNIT/ML Pen Inject 26 Units into the skin at bedtime.      metFORMIN (GLUCOPHAGE) 500 MG tablet Take 500 mg by mouth 2 (two) times daily with a meal.      Multiple Vitamin (MULTIVITAMIN) tablet Take 1 tablet by mouth daily. CENTRUM SILVER     olmesartan (BENICAR) 40 MG tablet Take 1 tablet (40 mg total) by mouth daily. 30 tablet 11   Omega-3 Fatty Acids (FISH OIL) 1200 MG CAPS Take 2,400 mg by mouth at bedtime.       oxyCODONE-acetaminophen (PERCOCET/ROXICET) 5-325 MG tablet Take 1-2 tablets by mouth every 6 (six) hours as needed for moderate pain. (Patient not taking: Reported on 02/04/2022) 20 tablet 0   No current facility-administered medications on file prior to visit.    There are no Patient Instructions on file for this visit. No follow-ups on file.   Georgiana Spinner, NP

## 2023-03-04 ENCOUNTER — Other Ambulatory Visit: Payer: Self-pay | Admitting: Internal Medicine

## 2023-03-06 NOTE — Progress Notes (Signed)
Kristin Hughes, female    DOB: 1950-11-24    MRN: [id number]   Brief patient profile:  79  yobf active smoker referred to pulmonary clinic in [place]  02/04/2022 by Dr Dionicia Abler  for emphysema.   History of Present Illness  02/04/2022  Pulmonary/ 1st office eval/ Kristin Hughes / [facility] [facility] on ACEi  Chief Complaint  Patient presents with   Consult    Ref for emphysema. Active smoker 1/2 ppd  Dyspnea:  walks dog x 5 min / just started on saba x 2 months  seems to help if uses prior to walking dog  Cough: sometimes in am but sporadic daytime / min mucoid sputum Sleep: bed is flat / 2 pillows under head  SABA use: none   Rec Stop lisinopril and start olmesartan 40 mg one daily  The key is to stop smoking completely before smoking completely stops you -it's not too late Only use your albuterol as a rescue medication  Ok to try albuterol 15 min before an activity (on alternating days)  that you know would usually make you short of breath  I recommend you get low dose CT chest yearly as part of your overall yearly check up   My office will be contacting you by phone for referral to [facility] > not done as of 03/07/2023        03/07/2023  f/u ov/[place] office/Kristin Hughes re: ? Copd / still smoking maint on no rx   Chief Complaint  Patient presents with   Follow-up  Dyspnea:  can push mow her yard, sometimes uses saba first seems to help  Cough: smoker's rattle/ mucoid  Sleeping: bed is flat/ 2 pillows s    resp cc  SABA use: rarely using  02: none   Lung cancer screening: referred back today.    No obvious day to day or daytime variability or assoc excess/ purulent sputum or mucus plugs or hemoptysis or cp or chest tightness, subjective wheeze or overt sinus or hb symptoms.    Also denies any obvious fluctuation of symptoms with weather or environmental changes or other aggravating or alleviating factors except as outlined above   No unusual exposure hx or h/o  childhood pna/ asthma or knowledge of premature birth.  Current Allergies, Complete Past Medical History, Past Surgical History, Family History, and Social History were reviewed in [employer] record.  ROS  The following are not active complaints unless bolded Hoarseness, sore throat, dysphagia, dental problems, itching, sneezing,  nasal congestion or discharge of excess mucus or purulent secretions, ear ache,   fever, chills, sweats, unintended wt loss or wt gain, classically pleuritic or exertional cp,  orthopnea pnd or arm/hand swelling  or leg swelling, presyncope, palpitations, abdominal pain, anorexia, nausea, vomiting, diarrhea  or change in bowel habits or change in bladder habits, change in stools or change in urine, dysuria, hematuria,  rash, arthralgias, visual complaints, headache, numbness, weakness or ataxia or problems with walking or coordination,  change in mood or  memory.        Current Meds  Medication Sig   amLODipine (NORVASC) 5 MG tablet Take 5 mg by mouth every morning.   aspirin EC 81 MG tablet Take 81 mg by mouth daily.   atorvastatin (LIPITOR) 40 MG tablet Take 40 mg by mouth every morning.   bisoprolol (ZEBETA) 5 MG tablet Take 1 tablet (5 mg total) by mouth daily.   cetirizine (ZYRTEC) 10 MG tablet Take 10 mg by  mouth every morning.   CINNAMON PO Take 2,000 mg by mouth at bedtime.   clopidogrel (PLAVIX) 75 MG tablet TAKE 1 TABLET BY MOUTH ONCE DAILY.   glimepiride (AMARYL) 4 MG tablet Take 4 mg by mouth daily with breakfast.   Ibuprofen-diphenhydrAMINE Cit 200-38 MG TABS Take 1 tablet by mouth at bedtime as needed (for sleep.).   LEVEMIR FLEXTOUCH 100 UNIT/ML Pen Inject 26 Units into the skin at bedtime.    metFORMIN (GLUCOPHAGE) 500 MG tablet Take 500 mg by mouth 2 (two) times daily with a meal.    Multiple Vitamin (MULTIVITAMIN) tablet Take 1 tablet by mouth daily. CENTRUM SILVER   Omega-3 Fatty Acids (FISH OIL) 1200 MG CAPS Take 2,400 mg by  mouth at bedtime.                  Past Medical History:  Diagnosis Date   AAA (abdominal aortic aneurysm) (HCC) 11/09/2021   a.) CTA abd/pelvis 11/09/2021: 5.3 x 5.2 cm fusiform AAA   Aortic atherosclerosis (HCC)    COPD (chronic obstructive pulmonary disease) (HCC)    Coronary artery disease    a.) Lexi 12/20/2021: EF >75%, LAD and LCx calcifications; no ischemia.   Diverticulosis    GERD (gastroesophageal reflux disease)    Hypertension    Left upper lobe pulmonary nodule 11/29/2020   a.) LDCT 11/29/2020: 4.1 mm subpleural LUL nodule.   Mixed hyperlipidemia    RBBB    Smokers' cough (HCC)    Tobacco use    Type 2 diabetes mellitus treated with insulin (HCC)        Objective:    Wts   03/07/2023       157   11/11/22 160 lb 6.4 oz (72.8 kg)  05/13/22 152 lb (68.9 kg)  02/15/22 148 lb 6.4 oz (67.3 kg)      Vital signs reviewed  03/07/2023  - Note at rest 02 sats  96% on RA   General appearance:    amb hoarse bf nad   HEENT : Oropharynx  clear/ edentulous       NECK :  without  apparent JVD/ palpable Nodes/TM    LUNGS: no acc muscle use,  Min barrel  contour chest wall with bilateral  slightly decreased bs s audible wheeze and  without cough on insp or exp maneuvers and min  Hyperresonant  to  percussion bilaterally    CV:  RRR  no s3 or murmur or increase in P2, and no edema   ABD:  soft and nontender with pos end  insp Hoover's  in the supine position.  No bruits or organomegaly appreciated   MS:  Nl gait/ ext warm without deformities Or obvious joint restrictions  calf tenderness, cyanosis or clubbing     SKIN: warm and dry without lesions    NEURO:  alert, approp, nl sensorium with  no motor or cerebellar deficits apparent.                 Assessment

## 2023-03-07 ENCOUNTER — Ambulatory Visit: Payer: Medicare Other | Admitting: Internal Medicine

## 2023-03-07 ENCOUNTER — Encounter: Payer: Self-pay | Admitting: Internal Medicine

## 2023-03-07 VITALS — BP 177/80 | HR 93 | Ht 64.0 in | Wt 157.0 lb

## 2023-03-07 DIAGNOSIS — F17210 Nicotine dependence, cigarettes, uncomplicated: Secondary | ICD-10-CM

## 2023-03-07 DIAGNOSIS — J449 Chronic obstructive pulmonary disease, unspecified: Secondary | ICD-10-CM

## 2023-03-07 DIAGNOSIS — I10 Essential (primary) hypertension: Secondary | ICD-10-CM

## 2023-03-07 MED ORDER — BISOPROLOL FUMARATE 5 MG PO TABS: 5.0000 mg | ORAL_TABLET | Freq: Every day | ORAL | 0 refills | Status: AC

## 2023-03-07 MED ORDER — OLMESARTAN MEDOXOMIL 40 MG PO TABS: 40.0000 mg | ORAL_TABLET | Freq: Every day | ORAL | 0 refills | Status: DC

## 2023-03-07 MED ORDER — BISOPROLOL FUMARATE 5 MG PO TABS: 5.0000 mg | ORAL_TABLET | Freq: Every day | ORAL | 11 refills | Status: DC

## 2023-03-07 NOTE — Assessment & Plan Note (Signed)
4-5 min discussion re active cigarette smoking in addition to office E&M  Ask about tobacco use:   ongoing  Advise quitting   I took an extended  opportunity with this patient to outline the consequences of continued cigarette use  in airway disorders based on all the data we have from the multiple national lung health studies (perfomed over decades at millions of dollars in cost)  indicating that smoking cessation, not choice of inhalers or pulmonary physicians, is the most important aspect of her  care.   Assess willingness:  Not committed at this point Assist in quit attempt:  Per PCP when ready Arrange follow up:   Follow up per Primary Care planned     Low-dose CT lung cancer screening is recommended for patients who are 13-75 years of age with a 20+ pack-year history of smoking and who are currently smoking or quit <=15 years ago. No coughing up blood  No unintentional weight loss of > 15 pounds in the last 6 months - pt is eligible for scanning yearly until age 22 >>> referred back  today

## 2023-03-07 NOTE — Assessment & Plan Note (Addendum)
Try off acei 02/04/2022 for pseudowheeze > resolved as of 03/07/2023 but still hoarse   Turns out hoarseness likely preceded ACEi exp  x years and she can't remember when her voice was nl so no further w/u for now. However, even in retrospect it may not be clear the ACEi contributed to the pt's symptoms,  Pt improved off them and adding them back at this point or in the future would risk confusion in interpretation of non-specific respiratory symptoms to which this patient is prone  ie  Better not to muddy the waters here.   Add bisoprolol 5 mg daily due to poorly controlled sbp with relative tachycardia and f/u all refills per PCP   Discussed in detail all the  indications, usual  risks and alternatives  relative to the benefits with patient who agrees to proceed with Rx as outlined.             Each maintenance medication was reviewed in detail including emphasizing most importantly the difference between maintenance and prns and under what circumstances the prns are to be triggered using an action plan format where appropriate.  Total time for H and P, chart review, counseling, reviewing hfa device(s) and generating customized AVS unique to this office visit / same day charting = 30 min

## 2023-03-07 NOTE — Patient Instructions (Addendum)
Bisoprolol 5 mg one daily x 3 month supply along with your olmesartn 40 mg  Refills for your blood pressure medication medications thru your PCP   Spiriva 2 puffs each am  (think of it like medium  octane fuel)   Only use your albuterol as a rescue medication to be used if you can't catch your breath by resting or doing a relaxed purse lip breathing pattern.  - The less you use it, the better it will work when you need it. - Ok to use up to 2 puffs  every 4 hours if you must but call for immediate appointment if use goes up over your usual need - Don't leave home without it !!  (think of it like the spare tire for your car)    My office will be contacting you by phone for referral to lung cancer program and PFTs next available both at [facility]   - if you don't hear back from my office within one week please call us back or notify us thru MyChart and we'll address it right away.  The key is to stop smoking completely before smoking completely stops you!        Pulmonary follow up is needed with all active medication

## 2023-03-07 NOTE — Assessment & Plan Note (Signed)
Active smoker with clinically very mild dz  - Marked upper airway wheezing on initial pulmonary eval  02/04/2022  - 02/04/2022 try off acei due to cough /hoarseness > cough improved but not hoarsenss (x decades per pt)  - 03/07/2023  After extensive coaching inhaler device,  effectiveness =  75% with smi > continue spiriva 1.25 x 2 each am and approp saba   Pt is Group B in terms of symptom/risk and laba/lama therefore appropriate rx at this point >>>  spiriva ok as long as not limited from desired activities/ pfts reoredered  Re SABA :  I spent extra time with pt today reviewing appropriate use of albuterol for prn use on exertion with the following points: 1) saba is for relief of sob that does not improve by walking a slower pace or resting but rather if the pt does not improve after trying this first. 2) If the pt is convinced, as many are, that saba helps recover from activity faster then it's easy to tell if this is the case by re-challenging : ie stop, take the inhaler, then p 5 minutes try the exact same activity (intensity of workload) that just caused the symptoms and see if they are substantially diminished or not after saba 3) if there is an activity that reproducibly causes the symptoms, try the saba 15 min before the activity on alternate days   If in fact the saba really does help, then fine to continue to use it prn but advised may need to look closer at the maintenance regimen(for now low doses spiriva)  being used to achieve better control of airways disease with exertion.    If not able to do what she wants to so sob or using more saba rec change rx to stiolto 2 puffs am same respimat device and if still not happy return here with all meds in hand using a trust but verify approach to confirm accurate Medication  Reconciliation The principal here is that until we are certain that the  patients are doing what we've asked, it makes no sense to ask them to do more.

## 2023-03-07 NOTE — Addendum Note (Signed)
Addended by: Winn Jock on: 03/07/2023 03:39 PM   Modules accepted: Orders

## 2023-05-08 ENCOUNTER — Other Ambulatory Visit (INDEPENDENT_AMBULATORY_CARE_PROVIDER_SITE_OTHER): Payer: Self-pay | Admitting: Nurse Practitioner

## 2023-05-08 DIAGNOSIS — I7143 Infrarenal abdominal aortic aneurysm, without rupture: Secondary | ICD-10-CM

## 2023-05-14 ENCOUNTER — Ambulatory Visit (INDEPENDENT_AMBULATORY_CARE_PROVIDER_SITE_OTHER): Payer: Medicare Other | Admitting: Nurse Practitioner

## 2023-05-14 ENCOUNTER — Ambulatory Visit (INDEPENDENT_AMBULATORY_CARE_PROVIDER_SITE_OTHER): Payer: Medicare Other

## 2023-05-14 ENCOUNTER — Encounter (INDEPENDENT_AMBULATORY_CARE_PROVIDER_SITE_OTHER): Payer: Self-pay | Admitting: Nurse Practitioner

## 2023-05-14 VITALS — BP 169/76 | HR 82 | Resp 18 | Ht 64.0 in | Wt 155.4 lb

## 2023-05-14 DIAGNOSIS — I7143 Infrarenal abdominal aortic aneurysm, without rupture: Secondary | ICD-10-CM

## 2023-05-14 DIAGNOSIS — E119 Type 2 diabetes mellitus without complications: Secondary | ICD-10-CM

## 2023-05-14 DIAGNOSIS — E782 Mixed hyperlipidemia: Secondary | ICD-10-CM

## 2023-05-19 NOTE — Progress Notes (Signed)
Subjective:    Patient ID: Kristin Hughes, female    DOB: 10-25-1950, 72 y.o.   MRN: [id number] Chief Complaint  Patient presents with   Follow-up    6 month follow up with [facility]    The patient returns to the office for surveillance of an abdominal aortic aneurysm status post stent graft placement on 01/14/2022.    Patient denies abdominal pain or back pain, no other abdominal complaints.  No symptoms consistent with distal embolization No changes in claudication distance or new rest pain symptoms. No interval development of new ulcers or wounds  There have been no significant interval changes in his overall healthcare since his last visit.   Patient denies amaurosis fugax or TIA symptoms.  The patient denies recent episodes of angina or shortness of breath.   Duplex US of the aorta and iliac arteries shows a 5.14 AAA sac with no endoleak, no change in the sac compared to the previous study.    Review of Systems  All other systems reviewed and are negative.      Objective:   Physical Exam Vitals reviewed.  HENT:     Head: Normocephalic.  Cardiovascular:     Rate and Rhythm: Normal rate.     Pulses: Normal pulses.  Pulmonary:     Effort: Pulmonary effort is normal.  Skin:    General: Skin is warm and dry.  Neurological:     Mental Status: She is alert and oriented to person, place, and time.  Psychiatric:        Mood and Affect: Mood normal.        Behavior: Behavior normal.        Thought Content: Thought content normal.        Judgment: Judgment normal.     BP (!) 169/76 (BP Location: Right Arm)   Pulse 82   Resp 18   Ht 5\' 4"  (1.626 m)   Wt 155 lb 6.4 oz (70.5 kg)   BMI 26.67 kg/m   Past Medical History:  Diagnosis Date   AAA (abdominal aortic aneurysm) (HCC) 11/09/2021   a.) CTA abd/pelvis 11/09/2021: 5.3 x 5.2 cm fusiform AAA   Aortic atherosclerosis (HCC)    COPD (chronic obstructive pulmonary disease) (HCC)    Coronary artery disease    a.)  Lexi 12/20/2021: EF >75%, LAD and LCx calcifications; no ischemia.   Diverticulosis    GERD (gastroesophageal reflux disease)    Hypertension    Left upper lobe pulmonary nodule 11/29/2020   a.) LDCT 11/29/2020: 4.1 mm subpleural LUL nodule.   Mixed hyperlipidemia    RBBB    Smokers' cough (HCC)    Tobacco use    Type 2 diabetes mellitus treated with insulin (HCC)     Social History   Socioeconomic History   Marital status: Single    Spouse name: Not on file   Number of children: Not on file   Years of education: Not on file   Highest education level: Not on file  Occupational History   Not on file  Tobacco Use   Smoking status: Every Day    Current packs/day: 1.00    Average packs/day: 1 pack/day for 45.0 years (45.0 ttl pk-yrs)    Types: Cigarettes   Smokeless tobacco: Never  Vaping Use   Vaping status: Never Used  Substance and Sexual Activity   Alcohol use: No   Drug use: No   Sexual activity: Not on file  Other Topics Concern  Not on file  Social History Narrative   Not on file   Social Determinants of Health   Financial Resource Strain: Not on file  Food Insecurity: Not on file  Transportation Needs: Not on file  Physical Activity: Not on file  Stress: Not on file  Social Connections: Unknown (11/04/2021)   Received from [facility], [facility]   Social Network    Social Network: Not on file  Intimate Partner Violence: Unknown (09/24/2021)   Received from [facility], [facility]   HITS    Physically Hurt: Not on file    Insult or Talk Down To: Not on file    Threaten Physical Harm: Not on file    Scream or Curse: Not on file    Past Surgical History:  Procedure Laterality Date   APPENDECTOMY  1970   COLONOSCOPY N/A 02/19/2017   Procedure: COLONOSCOPY;  Surgeon: Corbin Ade, MD;  Location: [facility];  Service: Endoscopy;  Laterality: N/A;  8:15 am   ENDOVASCULAR REPAIR/STENT GRAFT N/A 01/14/2022   Procedure: ENDOVASCULAR  REPAIR/STENT GRAFT;  Surgeon: Annice Needy, MD;  Location: [facility] INVASIVE CV LAB;  Service: Cardiovascular;  Laterality: N/A;   TUBAL LIGATION      Family History  Problem Relation Age of Onset   Colon cancer Father     Allergies  Allergen Reactions   Other     ORANGES/ CAUSES SWELLING IN EYES AND LIPS   Orange Oil     Other reaction(s): eyes, lips swells Other reaction(s): eyes, lips swells       Latest Ref Rng & Units 01/15/2022    4:58 AM 01/11/2022   11:55 AM  CBC  WBC 4.0 - 10.5 K/uL 14.7  8.1   Hemoglobin 12.0 - 15.0 g/dL 09.8  11.9   Hematocrit 36.0 - 46.0 % 37.0  44.3   Platelets 150 - 400 K/uL 188  249       CMP     Component Value Date/Time   NA 141 01/15/2022 0458   K 4.2 01/15/2022 0458   CL 109 01/15/2022 0458   CO2 25 01/15/2022 0458   GLUCOSE 164 (H) 01/15/2022 0458   BUN 10 01/15/2022 0458   CREATININE 0.94 01/15/2022 0458   CALCIUM 8.8 (L) 01/15/2022 0458   GFRNONAA >60 01/15/2022 0458     No results found.     Assessment & Plan:   1. Infrarenal abdominal aortic aneurysm (AAA) without rupture (HCC) Recommend:  Patient is status post successful endovascular repair of the AAA.   No further intervention is required at this time.   No endoleak is detected and the aneurysm sac is stable.  The patient will continue antiplatelet therapy as prescribed as well as aggressive management of hyperlipidemia. Exercise is encouraged.   However, endografts require continued surveillance with ultrasound or CT scan. This is mandatory to detect any changes that allow repressurization of the aneurysm sac.  The patient is informed that this would be asymptomatic.  The patient is reminded that lifelong routine surveillance is a necessity with an endograft. Patient will continue to follow-up at the specified interval with ultrasound of the aorta.  2. Type 2 diabetes mellitus without complication, without long-term current use of insulin (HCC) Continue  hypoglycemic medications as already ordered, these medications have been reviewed and there are no changes at this time.  Hgb A1C to be monitored as already arranged by primary service  3. Mixed hyperlipidemia Continue statin as ordered and reviewed, no  changes at this time   Current Outpatient Medications on File Prior to Visit  Medication Sig Dispense Refill   albuterol (VENTOLIN HFA) 108 (90 Base) MCG/ACT inhaler Inhale 1 puff into the lungs every 4 (four) hours as needed.     amLODipine (NORVASC) 5 MG tablet Take 5 mg by mouth every morning.     aspirin EC 81 MG tablet Take 81 mg by mouth daily.     atorvastatin (LIPITOR) 40 MG tablet Take 40 mg by mouth every morning.     bisoprolol (ZEBETA) 5 MG tablet Take 1 tablet (5 mg total) by mouth daily. 90 tablet 0   cetirizine (ZYRTEC) 10 MG tablet Take 10 mg by mouth every morning.     CINNAMON PO Take 2,000 mg by mouth at bedtime.     clopidogrel (PLAVIX) 75 MG tablet TAKE 1 TABLET BY MOUTH ONCE DAILY. 30 tablet 6   glimepiride (AMARYL) 4 MG tablet Take 4 mg by mouth daily with breakfast.     Ibuprofen-diphenhydrAMINE Cit 200-38 MG TABS Take 1 tablet by mouth at bedtime as needed (for sleep.).     LEVEMIR FLEXTOUCH 100 UNIT/ML Pen Inject 26 Units into the skin at bedtime.      metFORMIN (GLUCOPHAGE) 500 MG tablet Take 500 mg by mouth 2 (two) times daily with a meal.      Multiple Vitamin (MULTIVITAMIN) tablet Take 1 tablet by mouth daily. CENTRUM SILVER     olmesartan (BENICAR) 40 MG tablet Take 1 tablet (40 mg total) by mouth daily. 90 tablet 0   Omega-3 Fatty Acids (FISH OIL) 1200 MG CAPS Take 2,400 mg by mouth at bedtime.      LANTUS SOLOSTAR 100 UNIT/ML Solostar Pen Inject into the skin at bedtime.     No current facility-administered medications on file prior to visit.    There are no Patient Instructions on file for this visit. No follow-ups on file.   Georgiana Spinner, NP

## 2023-05-26 ENCOUNTER — Other Ambulatory Visit: Payer: Self-pay | Admitting: Internal Medicine

## 2023-07-03 ENCOUNTER — Ambulatory Visit (HOSPITAL_COMMUNITY)
Admission: RE | Admit: 2023-07-03 | Discharge: 2023-07-03 | Disposition: A | Discharge status: Home / Self Care | Payer: Medicare Other | Source: Ambulatory Visit | Attending: Internal Medicine | Admitting: Internal Medicine

## 2023-07-03 ENCOUNTER — Other Ambulatory Visit: Payer: Self-pay | Admitting: Internal Medicine

## 2023-07-03 DIAGNOSIS — J449 Chronic obstructive pulmonary disease, unspecified: Secondary | ICD-10-CM | POA: Diagnosis present

## 2023-07-03 DIAGNOSIS — F17210 Nicotine dependence, cigarettes, uncomplicated: Secondary | ICD-10-CM

## 2023-07-03 DIAGNOSIS — I10 Essential (primary) hypertension: Secondary | ICD-10-CM | POA: Diagnosis present

## 2023-07-03 LAB — PULMONARY FUNCTION TEST
DL/VA % pred: 91 %
DL/VA: 3.78 ml/min/mmHg/L
DLCO unc % pred: 67 %
DLCO unc: 12.74 ml/min/mmHg
FEF 25-75 Post: 0.46 L/s
FEF 25-75 Pre: 0.4 L/s
FEF2575-%Change-Post: 15 %
FEF2575-%Pred-Post: 26 %
FEF2575-%Pred-Pre: 23 %
FEV1-%Change-Post: 6 %
FEV1-%Pred-Post: 46 %
FEV1-%Pred-Pre: 43 %
FEV1-Post: 0.99 L
FEV1-Pre: 0.93 L
FEV1FVC-%Change-Post: 0 %
FEV1FVC-%Pred-Pre: 77 %
FEV6-%Change-Post: 4 %
FEV6-%Pred-Post: 60 %
FEV6-%Pred-Pre: 57 %
FEV6-Post: 1.63 L
FEV6-Pre: 1.56 L
FEV6FVC-%Change-Post: -2 %
FEV6FVC-%Pred-Post: 100 %
FEV6FVC-%Pred-Pre: 102 %
FVC-%Change-Post: 7 %
FVC-%Pred-Post: 59 %
FVC-%Pred-Pre: 55 %
FVC-Post: 1.71 L
FVC-Pre: 1.59 L
Post FEV1/FVC ratio: 58 %
Post FEV6/FVC ratio: 96 %
Pre FEV1/FVC ratio: 58 %
Pre FEV6/FVC Ratio: 98 %
RV % pred: 111 %
RV: 2.52 L
TLC % pred: 85 %
TLC: 4.32 L

## 2023-07-03 MED ORDER — ALBUTEROL SULFATE (2.5 MG/3ML) 0.083% IN NEBU
2.5000 mg | INHALATION_SOLUTION | Freq: Once | RESPIRATORY_TRACT | Status: AC
  Administered 2023-07-03: 2.5 mg via RESPIRATORY_TRACT

## 2023-07-10 NOTE — Progress Notes (Signed)
Spoke with pt and notified of results per Dr. Wert. Pt verbalized understanding and denied any questions. 

## 2023-07-10 NOTE — Progress Notes (Signed)
Dr Sherene Sires- pt states that she is taking spiriva, not trelegy. She says she would like something else since the spiriva is expensive.

## 2023-07-14 ENCOUNTER — Telehealth: Payer: Self-pay

## 2023-07-16 ENCOUNTER — Telehealth: Payer: Self-pay

## 2023-07-16 NOTE — Telephone Encounter (Signed)
I left a message for the patient to return my call.

## 2023-07-16 NOTE — Telephone Encounter (Signed)
-----   Message from [facility] Tahni Porchia V sent at 07/15/2023  2:51 PM EST ----- We have the 1.25 is this okay to give pt ,please advise

## 2023-08-06 NOTE — Telephone Encounter (Signed)
error 

## 2023-11-24 ENCOUNTER — Other Ambulatory Visit: Payer: Self-pay | Admitting: Internal Medicine

## 2023-12-02 IMAGING — CT CT CTA ABD/PEL W/CM AND/OR W/O CM
2 of 8 series · 12 of 46 positions shown, 14 images · IV contrast (Omnipaque or Isovue)
Comparison: Chest CT-11/29/2020; 03/01/2014.

CLINICAL DATA: Low back pain for the past several months.

EXAM:
CTA ABDOMEN AND PELVIS WITHOUT AND WITH CONTRAST
TECHNIQUE: Multidetector CT imaging of the abdomen and pelvis was performed
using the standard protocol during bolus administration of
intravenous contrast. Multiplanar reconstructed images and MIPs were
obtained and reviewed to evaluate the vascular anatomy.

[Series 5: arterial thins · axial · arterial · 0.71mm/px · z∈[+965,+1329]mm · 9 of 751 slices shown, 11 images]
[im 72/751  soft-tissue]
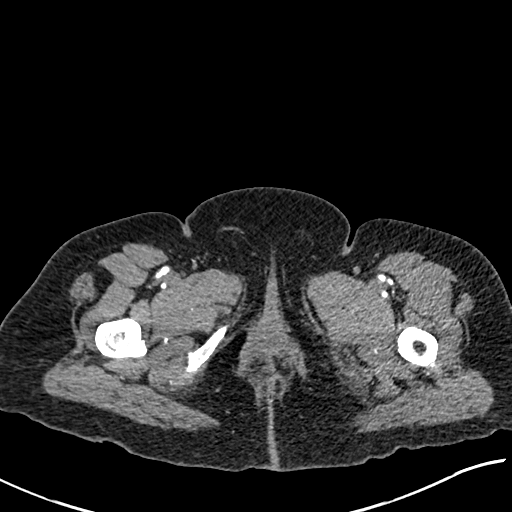
[im 72/751  bone]
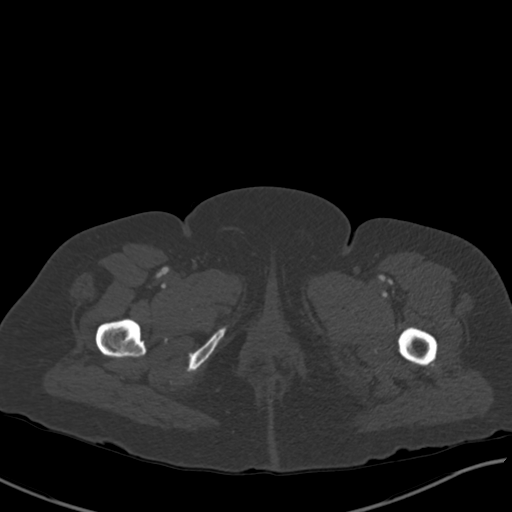
[im 143/751  soft-tissue]
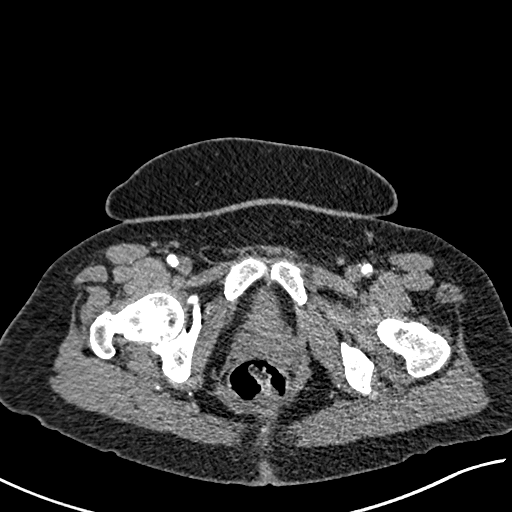
[im 215/751  soft-tissue]
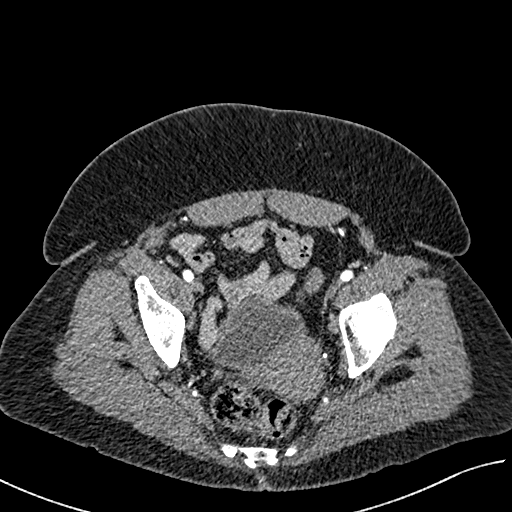
[im 286/751  soft-tissue]
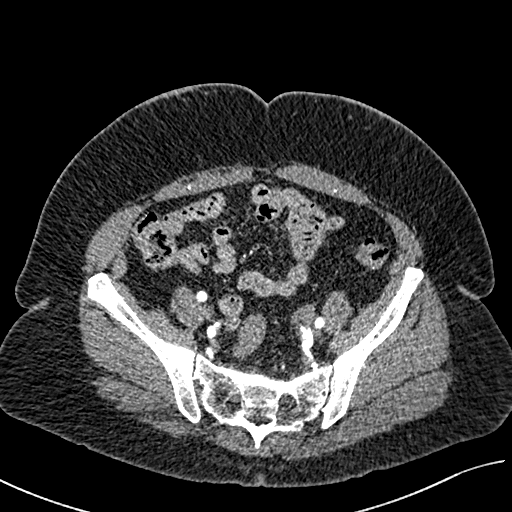
[im 393/751  soft-tissue]
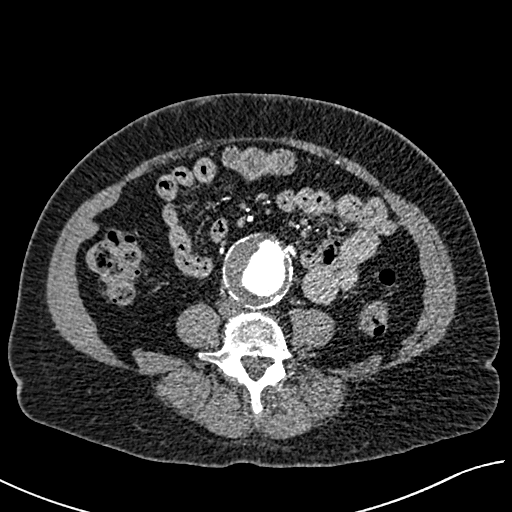
[im 465/751  soft-tissue]
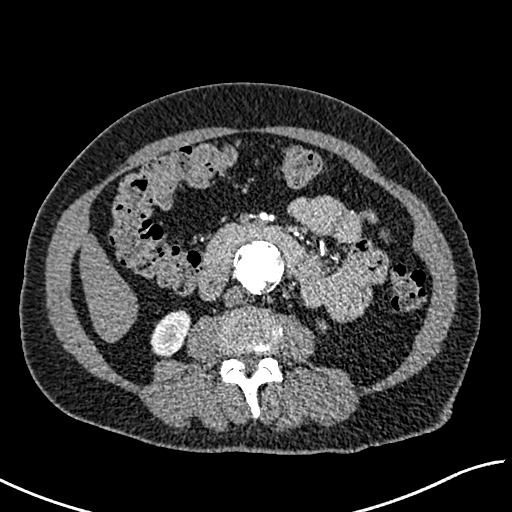
[im 536/751  soft-tissue]
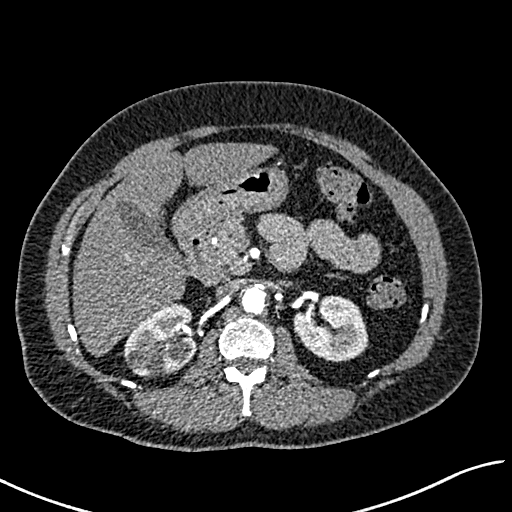
[im 608/751  soft-tissue]
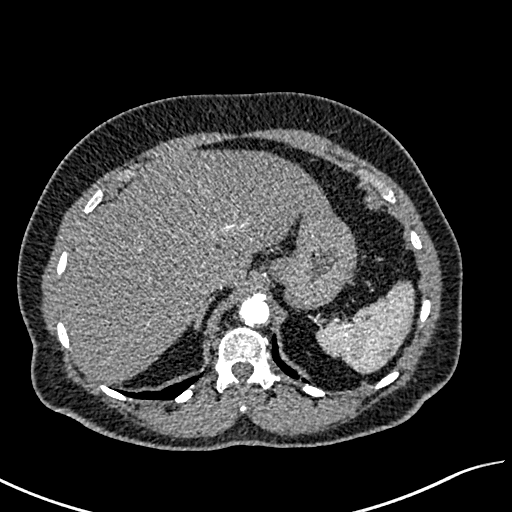
[im 679/751  soft-tissue]
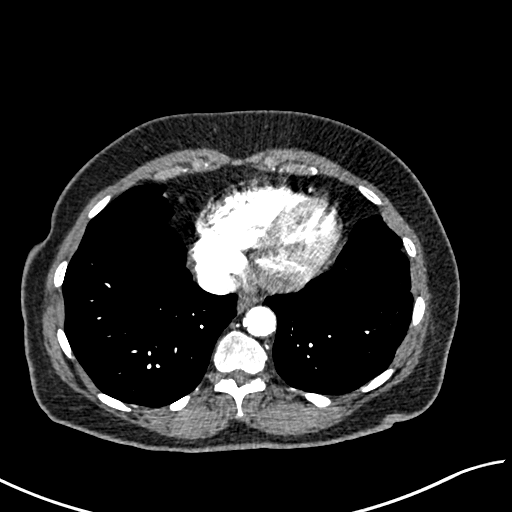
[im 679/751  bone]
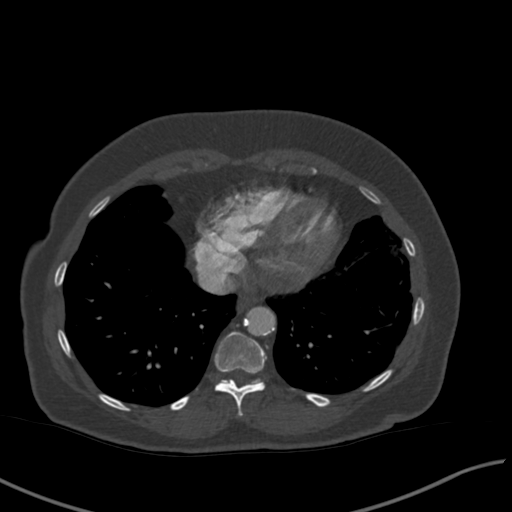

[Series 7: cor arterial · coronal · arterial · 0.77mm/px · 3 of 153 slices shown]
[im 39/153  soft-tissue]
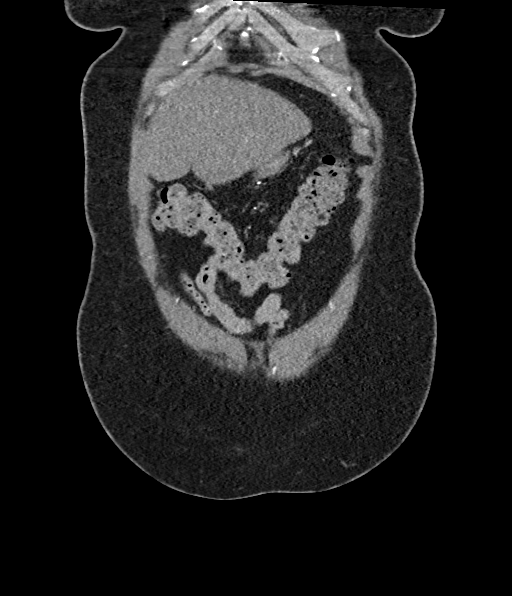
[im 77/153  soft-tissue]
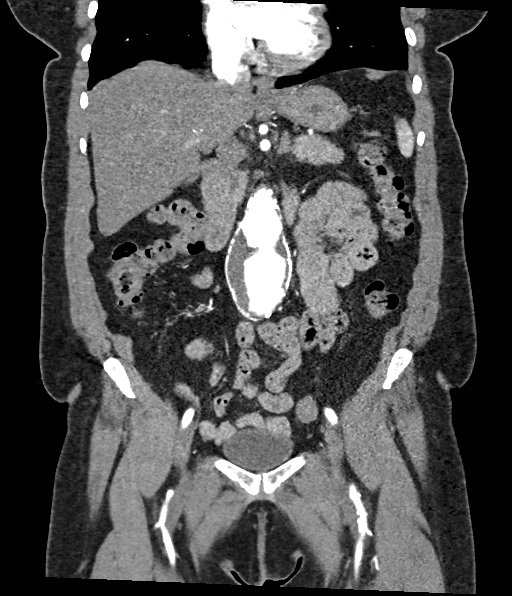
[im 115/153  soft-tissue]
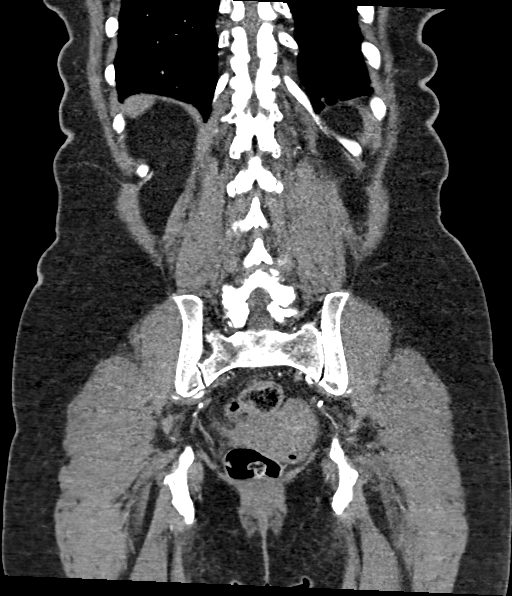

[12 of 46 positions shown; findings below may reference images not displayed]

RADIATION DOSE REDUCTION: This exam was performed according to the
departmental dose-optimization program which includes automated
exposure control, adjustment of the mA and/or kV according to
patient size and/or use of iterative reconstruction technique.

CONTRAST:  100mL OMNIPAQUE IOHEXOL 350 MG/ML SOLN
FINDINGS: VASCULAR

Aorta: Fusiform aneurysmal dilatation of the abdominal aorta
measuring 5.3 x 5.2 cm (axial image 103, series 4; sagittal image
104, series 8). The aneurysm begins approximately 3.6 cm caudal to
the take-off of the most inferior left renal artery and extends to
the level of the aortic bifurcation. There is a nonocclusive intimal
web within the abdominal aorta just superior to the abdominal aortic
aneurysm (image 84, series 4) without evidence of dissection. There
is a large amount of mixed calcified and noncalcified thrombus
throughout the abdominal aorta, not resulting in a hemodynamically
significant stenosis. No periaortic stranding.

Celiac: There is a minimal amount of mixed calcified and
noncalcified atherosclerotic plaque involving the origin the celiac
artery, not definitely resulting in hemodynamically significant
stenosis. Conventional branching pattern.

SMA: There is a large amount of eccentric calcified atherosclerotic
plaque involving the origin the SMA which results in at least 50%
luminal narrowing (sagittal image 104, series 8). Hemodynamic
significance is supported by mild hypertrophy of the GDA and several
pancreaticoduodenal arterial collaterals from the celiac artery.
Conventional branching pattern. The distal tributaries of the SMA
appear widely patent without discrete internal filling defect to
suggest distal embolism.

Renals: Solitary bilaterally there is a minimal amount of mixed
calcified and noncalcified atherosclerotic plaque involving the
origin and proximal aspects of the bilateral renal arteries, not
resulting in a hemodynamically significant stenosis. No definitive
evidence of FMD.

IMA: Remains patent.

Inflow: There is a large amount of slightly irregular mixed
calcified and noncalcified atherosclerotic plaque involving the
bilateral normal caliber common iliac arteries which approaches 50%
luminal narrowing bilaterally.

The bilateral internal iliac arteries are heavily diseased though
patent and of normal caliber. The bilateral external iliac arteries
are tortuous though widely patent and of normal caliber.

Proximal Outflow: Minimal to moderate amount of mixed calcified and
noncalcified atherosclerotic plaque involves the bilateral common
femoral arteries as well as the imaged proximal aspects of the
bilateral superficial femoral arteries, not resulting in a
hemodynamically significant stenosis. The bilateral deep femoral
arteries are patent throughout their imaged courses.

Veins: The IVC and pelvic venous systems appear widely patent.

Review of the MIP images confirms the above findings.

_________________________________________________________

_________________________________________________________

NON-VASCULAR

Lower chest: Limited visualization of the lower thorax demonstrates
minimal bibasilar subsegmental atelectasis. No discrete focal
airspace opacities. No pleural effusion. Normal heart size. No
pericardial effusion.

Hepatobiliary: Normal hepatic contour. There is mild diffuse
decreased attenuation of the hepatic parenchyma suggestive of
hepatic steatosis. No discrete hepatic lesions. Normal appearance of
the gallbladder given degree of distention. No radiopaque
gallstones. No intra or extrahepatic biliary ductal dilatation. No
ascites.

Pancreas: Normal appearance of the pancreas.

Spleen: Normal appearance of the spleen.

Adrenals/Urinary Tract: There is symmetric enhancement and excretion
of the bilateral kidneys. Note is made of 5 right-sided renal cysts,
the largest of which measures 1.4 cm in diameter (image 19, series
11). Additional subcentimeter hypoattenuating right-sided renal
lesions are too small to adequately characterize though favored to
represent additional renal cysts. No discrete left-sided renal
lesions. No urinary obstruction or perinephric stranding.

Normal appearance of the bilateral adrenal glands.

Normal appearance of the urinary bladder given degree of distention.

Stomach/Bowel: Moderate to large colonic stool burden without
evidence of enteric obstruction. Scattered colonic diverticulosis
without evidence of superimposed acute diverticulitis. Normal
appearance of the terminal ileum. The appendix is not visualized
compatible with provided operative history. No significant hiatal
hernia. No pneumoperitoneum, pneumatosis or portal venous gas.

Lymphatic: No bulky retroperitoneal, mesenteric, pelvic or inguinal
lymphadenopathy.

Reproductive: Normal appearance of the pelvic organs. No discrete
adnexal lesions. No free fluid in the pelvic cul-de-sac.

Other: Tiny mesenteric fat containing periumbilical hernia.

Musculoskeletal: No acute or aggressive osseous abnormalities. Note
is made of partial lumbarization of the S1 vertebral body. Mild
degenerative change the bilateral hips with joint space loss,
subchondral sclerosis and osteophytosis. There is a peripherally
corticated ossicle adjacent to the right greater trochanter.
IMPRESSION: VASCULAR

1. Fusiform aneurysmal dilatation of the abdominal aorta measuring
5.3 cm in diameter. If not previously performed, vascular surgery
consultation is advised. Otherwise, recommend follow-up CTA of the
abdomen and pelvis in 3-6 months. This recommendation follows ACR
consensus guidelines: White Paper of the ACR Incidental Findings
Committee II on Vascular Findings. [HOSPITAL] 9792;
[DATE].
2. Nonocclusive intimal web within the infrarenal abdominal aorta
without evidence of abdominal aortic dissection or perivascular
stranding.
3. Suspected hemodynamically significant narrowing involving the SMA
however the celiac and IMA remain patent (though note, the IMA comes
off of the aneurysmal component of the abdominal aorta).
4. Irregular mixed calcified and noncalcified atherosclerotic plaque
results in approximately 50% luminal narrowing of the bilateral
common iliac arteries.

NON-VASCULAR

1. Scattered colonic diverticulosis without evidence superimposed
acute diverticulitis.
2. Suspected hepatic steatosis.  Correlation with LFTs is advised.

These results will be called to the ordering clinician or
representative by the Radiologist Assistant, and communication
documented in the PACS or [REDACTED].

## 2024-02-19 ENCOUNTER — Other Ambulatory Visit: Payer: Self-pay | Admitting: Internal Medicine

## 2024-05-11 ENCOUNTER — Other Ambulatory Visit (INDEPENDENT_AMBULATORY_CARE_PROVIDER_SITE_OTHER): Payer: Self-pay | Admitting: Nurse Practitioner

## 2024-05-11 DIAGNOSIS — I714 Abdominal aortic aneurysm, without rupture, unspecified: Secondary | ICD-10-CM

## 2024-05-13 ENCOUNTER — Encounter (INDEPENDENT_AMBULATORY_CARE_PROVIDER_SITE_OTHER): Payer: Self-pay | Admitting: Nurse Practitioner

## 2024-05-13 ENCOUNTER — Ambulatory Visit (INDEPENDENT_AMBULATORY_CARE_PROVIDER_SITE_OTHER): Payer: Medicare Other

## 2024-05-13 ENCOUNTER — Ambulatory Visit (INDEPENDENT_AMBULATORY_CARE_PROVIDER_SITE_OTHER): Payer: Medicare Other | Admitting: Nurse Practitioner

## 2024-05-13 VITALS — BP 150/72 | HR 85 | Resp 16 | Ht 64.0 in | Wt 149.0 lb

## 2024-05-13 DIAGNOSIS — I10 Essential (primary) hypertension: Secondary | ICD-10-CM

## 2024-05-13 DIAGNOSIS — I714 Abdominal aortic aneurysm, without rupture, unspecified: Secondary | ICD-10-CM | POA: Diagnosis not present

## 2024-05-13 DIAGNOSIS — E119 Type 2 diabetes mellitus without complications: Secondary | ICD-10-CM

## 2024-05-13 DIAGNOSIS — I7143 Infrarenal abdominal aortic aneurysm, without rupture: Secondary | ICD-10-CM | POA: Diagnosis not present

## 2024-05-13 NOTE — Progress Notes (Signed)
 SUBJECTIVE:  Patient ID: Kristin Hughes, female    DOB: 08-Apr-1951, 73 y.o.   MRN: [id number] Chief Complaint  Patient presents with   Follow-up    1 year EVAR    Discussed the use of AI scribe software for clinical note transcription with the patient, who gave verbal consent to proceed.  History of Present Illness Kristin Hughes is a 73 year old female with an aortic aneurysm who presents for a routine follow-up.  She denies any current issues.  She denies any abdominal pain.  She denies any claudication symptoms.  She has a history of an aortic aneurysm that was repaired endovascularly on 01/14/2022.  On 05/14/2023, previously measured at 5.14 cm. During today's visit, the EVAR measures 4.73 cm.  No evidence of endoleak noted.     Results RADIOLOGY Aortic aneurysm ultrasound: Aneurysm size 4.73 cm, no endoleak (05/13/2024)  Past Medical History:  Diagnosis Date   AAA (abdominal aortic aneurysm) 11/09/2021   a.) CTA abd/pelvis 11/09/2021: 5.3 x 5.2 cm fusiform AAA   Aortic atherosclerosis    COPD (chronic obstructive pulmonary disease) (HCC)    Coronary artery disease    a.) Lexi 12/20/2021: EF >75%, LAD and LCx calcifications; no ischemia.   Diverticulosis    GERD (gastroesophageal reflux disease)    Hypertension    Left upper lobe pulmonary nodule 11/29/2020   a.) LDCT 11/29/2020: 4.1 mm subpleural LUL nodule.   Mixed hyperlipidemia    RBBB    Smokers' cough (HCC)    Tobacco use    Type 2 diabetes mellitus treated with insulin  [facility])     Past Surgical History:  Procedure Laterality Date   APPENDECTOMY  1970   COLONOSCOPY N/A 02/19/2017   Procedure: COLONOSCOPY;  Surgeon: Shaaron Lamar HERO, MD;  Location: [facility];  Service: Endoscopy;  Laterality: N/A;  8:15 am   ENDOVASCULAR STENT GRAFT (AAA) N/A 01/14/2022   Procedure: ENDOVASCULAR REPAIR/STENT GRAFT;  Surgeon: Marea Selinda RAMAN, MD;  Location: [facility] INVASIVE [facility];  Service: Cardiovascular;  Laterality: N/A;    TUBAL LIGATION      Social History   Socioeconomic History   Marital status: Single    Spouse name: Not on file   Number of children: Not on file   Years of education: Not on file   Highest education level: Not on file  Occupational History   Not on file  Tobacco Use   Smoking status: Every Day    Current packs/day: 1.00    Average packs/day: 1 pack/day for 45.0 years (45.0 ttl pk-yrs)    Types: Cigarettes   Smokeless tobacco: Never  Vaping Use   Vaping status: Never Used  Substance and Sexual Activity   Alcohol use: No   Drug use: No   Sexual activity: Not on file  Other Topics Concern   Not on file  Social History Narrative   Not on file   Social Drivers of Health   Financial Resource Strain: Not on file  Food Insecurity: Not on file  Transportation Needs: Not on file  Physical Activity: Not on file  Stress: Not on file  Social Connections: Not on file  Intimate Partner Violence: Not on file    Family History  Problem Relation Age of Onset   Colon cancer Father     Allergies  Allergen Reactions   Other     ORANGES/ CAUSES SWELLING IN EYES AND LIPS   Orange Oil     Other reaction(s): eyes, lips swells Other  reaction(s): eyes, lips swells     Review of Systems   Review of Systems: Negative Unless Checked Constitutional: [] Weight loss  [] Fever  [] Chills Cardiac: [] Chest pain   []  Atrial Fibrillation  [] Palpitations   [] Shortness of breath when laying flat   [] Shortness of breath with exertion. [] Shortness of breath at rest Vascular:  [] Pain in legs with walking   [] Pain in legs with standing [] Pain in legs when laying flat   [] Claudication    [] Pain in feet when laying flat    [] History of DVT   [] Phlebitis   [] Swelling in legs   [] Varicose veins   [] Non-healing ulcers Pulmonary:   [] Uses home oxygen   [] Productive cough   [] Hemoptysis   [] Wheeze  [] COPD   [] Asthma Neurologic:  [] Dizziness   [] Seizures  [] Blackouts [] History of stroke   [] History of TIA   [] Aphasia   [] Temporary Blindness   [] Weakness or numbness in arm   [] Weakness or numbness in leg Musculoskeletal:   [] Joint swelling   [] Joint pain   [] Low back pain  []  History of Knee Replacement [] Arthritis [] back Surgeries  []  Spinal Stenosis    Hematologic:  [] Easy bruising  [] Easy bleeding   [] Hypercoagulable state   [] Anemic Gastrointestinal:  [] Diarrhea   [] Vomiting  [] Gastroesophageal reflux/heartburn   [] Difficulty swallowing. [] Abdominal pain Genitourinary:  [] Chronic kidney disease   [] Difficult urination  [] Anuric   [] Blood in urine [] Frequent urination  [] Burning with urination   [] Hematuria Skin:  [] Rashes   [] Ulcers [] Wounds Psychological:  [] History of anxiety   []  History of major depression  []  Memory Difficulties      OBJECTIVE:     BP (!) 150/72   Pulse 85   Resp 16   Ht 5' 4 (1.626 m)   Wt 149 lb (67.6 kg)   BMI 25.58 kg/m   Physical Exam Vitals reviewed.  HENT:     Head: Normocephalic.  Cardiovascular:     Rate and Rhythm: Normal rate.     Pulses: Normal pulses.  Pulmonary:     Effort: Pulmonary effort is normal.  Skin:    General: Skin is warm and dry.  Neurological:     Mental Status: She is alert and oriented to person, place, and time.  Psychiatric:        Mood and Affect: Mood normal.        Behavior: Behavior normal.        Thought Content: Thought content normal.        Judgment: Judgment normal.    Physical Exam     CMP     Component Value Date/Time   NA 141 01/15/2022 0458   K 4.2 01/15/2022 0458   CL 109 01/15/2022 0458   CO2 25 01/15/2022 0458   GLUCOSE 164 (H) 01/15/2022 0458   BUN 10 01/15/2022 0458   CREATININE 0.94 01/15/2022 0458   CALCIUM  8.8 (L) 01/15/2022 0458   GFRNONAA >60 01/15/2022 0458    No results found.     ASSESSMENT AND PLAN:  1. Infrarenal abdominal aortic aneurysm (AAA) without rupture (Primary) Recommend:  Patient is status post successful endovascular repair of the AAA.   No further  intervention is required at this time.   No endoleak is detected and the aneurysm sac is stable.  The patient will continue antiplatelet therapy as prescribed as well as aggressive management of hyperlipidemia. Exercise is encouraged.   However, endografts require continued surveillance with ultrasound or CT scan. This is mandatory to detect  any changes that allow repressurization of the aneurysm sac.  The patient is informed that this would be asymptomatic.  The patient is reminded that lifelong routine surveillance is a necessity with an endograft. Patient will continue to follow-up at the specified interval with ultrasound of the aorta.  2. Type 2 diabetes mellitus without complication, without long-term current use of insulin  (HCC) Continue hypoglycemic medications as already ordered, these medications have been reviewed and there are no changes at this time.  Hgb A1C to be monitored as already arranged by primary service  3. Benign essential hypertension Continue antihypertensive medications as already ordered, these medications have been reviewed and there are no changes at this time.    Current Outpatient Medications on File Prior to Visit  Medication Sig Dispense Refill   albuterol  (VENTOLIN  HFA) 108 (90 Base) MCG/ACT inhaler Inhale 1 puff into the lungs every 4 (four) hours as needed.     amLODipine  (NORVASC ) 5 MG tablet Take 5 mg by mouth every morning.     aspirin  EC 81 MG tablet Take 81 mg by mouth daily.     atorvastatin  (LIPITOR) 40 MG tablet Take 40 mg by mouth every morning.     bisoprolol  (ZEBETA ) 5 MG tablet Take 1 tablet (5 mg total) by mouth daily. 90 tablet 0   cetirizine (ZYRTEC) 10 MG tablet Take 10 mg by mouth every morning.     CINNAMON  PO Take 2,000 mg by mouth at bedtime.     Ibuprofen-diphenhydrAMINE  Cit 200-38 MG TABS Take 1 tablet by mouth at bedtime as needed (for sleep.).     LANTUS SOLOSTAR 100 UNIT/ML Solostar Pen Inject into the skin at bedtime.      LEVEMIR  FLEXTOUCH 100 UNIT/ML Pen Inject 26 Units into the skin at bedtime.      metFORMIN (GLUCOPHAGE) 500 MG tablet Take 500 mg by mouth 2 (two) times daily with a meal.      Multiple Vitamin (MULTIVITAMIN) tablet Take 1 tablet by mouth daily. CENTRUM SILVER     olmesartan  (BENICAR ) 40 MG tablet TAKE ONE TABLET BY MOUTH ONCE DAILY 90 tablet 0   Omega-3 Fatty Acids (FISH OIL) 1200 MG CAPS Take 2,400 mg by mouth at bedtime.      clopidogrel  (PLAVIX ) 75 MG tablet TAKE 1 TABLET BY MOUTH ONCE DAILY. (Patient not taking: Reported on 05/13/2024) 30 tablet 6   glimepiride  (AMARYL ) 4 MG tablet Take 4 mg by mouth daily with breakfast. (Patient not taking: Reported on 05/13/2024)     No current facility-administered medications on file prior to visit.    There are no Patient Instructions on file for this visit. No follow-ups on file.   Javarie Crisp E Anay Walter, NP  This note was completed with Office Manager.  Any errors are purely unintentional.

## 2025-05-12 ENCOUNTER — Other Ambulatory Visit (INDEPENDENT_AMBULATORY_CARE_PROVIDER_SITE_OTHER)

## 2025-05-12 ENCOUNTER — Ambulatory Visit (INDEPENDENT_AMBULATORY_CARE_PROVIDER_SITE_OTHER): Admitting: Nurse Practitioner
